# Patient Record
Sex: Male | Born: 1955 | Race: White | Hispanic: No | Marital: Married | Smoking: Former smoker
Health system: Southern US, Community
[De-identification: ages and names within clinical notes are randomized; demographics above are authoritative.]

## PROBLEM LIST (undated history)

## (undated) DIAGNOSIS — I10 Essential (primary) hypertension: Secondary | ICD-10-CM

## (undated) DIAGNOSIS — I779 Disorder of arteries and arterioles, unspecified: Secondary | ICD-10-CM

## (undated) DIAGNOSIS — M1990 Unspecified osteoarthritis, unspecified site: Secondary | ICD-10-CM

## (undated) DIAGNOSIS — E785 Hyperlipidemia, unspecified: Secondary | ICD-10-CM

## (undated) DIAGNOSIS — G4730 Sleep apnea, unspecified: Secondary | ICD-10-CM

## (undated) DIAGNOSIS — I739 Peripheral vascular disease, unspecified: Secondary | ICD-10-CM

## (undated) DIAGNOSIS — E119 Type 2 diabetes mellitus without complications: Secondary | ICD-10-CM

## (undated) DIAGNOSIS — G459 Transient cerebral ischemic attack, unspecified: Secondary | ICD-10-CM

## (undated) DIAGNOSIS — R55 Syncope and collapse: Secondary | ICD-10-CM

## (undated) DIAGNOSIS — I2510 Atherosclerotic heart disease of native coronary artery without angina pectoris: Secondary | ICD-10-CM

## (undated) HISTORY — DX: Unspecified osteoarthritis, unspecified site: M19.90

## (undated) HISTORY — DX: Hyperlipidemia, unspecified: E78.5

## (undated) HISTORY — DX: Essential (primary) hypertension: I10

## (undated) HISTORY — PX: CARDIAC CATHETERIZATION: SHX172

## (undated) HISTORY — DX: Transient cerebral ischemic attack, unspecified: G45.9

## (undated) HISTORY — DX: Type 2 diabetes mellitus without complications: E11.9

## (undated) HISTORY — DX: Peripheral vascular disease, unspecified: I73.9

## (undated) HISTORY — DX: Disorder of arteries and arterioles, unspecified: I77.9

## (undated) HISTORY — DX: Atherosclerotic heart disease of native coronary artery without angina pectoris: I25.10

## (undated) HISTORY — DX: Syncope and collapse: R55

## (undated) HISTORY — PX: OTHER SURGICAL HISTORY: SHX169

---

## 1978-02-20 DIAGNOSIS — I219 Acute myocardial infarction, unspecified: Secondary | ICD-10-CM

## 1978-02-20 HISTORY — DX: Acute myocardial infarction, unspecified: I21.9

## 2000-10-24 ENCOUNTER — Ambulatory Visit (HOSPITAL_COMMUNITY): Admission: RE | Admit: 2000-10-24 | Discharge: 2000-10-24 | Payer: Self-pay | Admitting: Family Medicine

## 2000-10-24 ENCOUNTER — Encounter: Payer: Self-pay | Admitting: Family Medicine

## 2001-05-30 ENCOUNTER — Inpatient Hospital Stay (HOSPITAL_COMMUNITY): Admission: EM | Admit: 2001-05-30 | Discharge: 2001-05-31 | Payer: Self-pay | Admitting: Cardiology

## 2001-05-30 ENCOUNTER — Encounter: Payer: Self-pay | Admitting: Emergency Medicine

## 2001-05-30 ENCOUNTER — Emergency Department (HOSPITAL_COMMUNITY): Admission: EM | Admit: 2001-05-30 | Discharge: 2001-05-30 | Payer: Self-pay | Admitting: Emergency Medicine

## 2001-11-04 ENCOUNTER — Ambulatory Visit (HOSPITAL_COMMUNITY): Admission: RE | Admit: 2001-11-04 | Discharge: 2001-11-04 | Payer: Self-pay | Admitting: Family Medicine

## 2001-11-04 ENCOUNTER — Encounter: Payer: Self-pay | Admitting: Family Medicine

## 2001-11-10 ENCOUNTER — Emergency Department (HOSPITAL_COMMUNITY): Admission: EM | Admit: 2001-11-10 | Discharge: 2001-11-10 | Payer: Self-pay | Admitting: Emergency Medicine

## 2001-11-14 ENCOUNTER — Other Ambulatory Visit: Admission: RE | Admit: 2001-11-14 | Discharge: 2001-11-14 | Payer: Self-pay | Admitting: Dermatology

## 2002-05-03 ENCOUNTER — Inpatient Hospital Stay (HOSPITAL_COMMUNITY): Admission: EM | Admit: 2002-05-03 | Discharge: 2002-05-04 | Payer: Self-pay | Admitting: *Deleted

## 2002-08-19 ENCOUNTER — Inpatient Hospital Stay (HOSPITAL_COMMUNITY): Admission: EM | Admit: 2002-08-19 | Discharge: 2002-08-21 | Payer: Self-pay | Admitting: Emergency Medicine

## 2002-08-19 ENCOUNTER — Encounter: Payer: Self-pay | Admitting: Emergency Medicine

## 2002-08-19 ENCOUNTER — Encounter: Payer: Self-pay | Admitting: Family Medicine

## 2002-08-20 ENCOUNTER — Encounter: Payer: Self-pay | Admitting: Neurology

## 2002-08-22 ENCOUNTER — Ambulatory Visit (HOSPITAL_COMMUNITY): Admission: RE | Admit: 2002-08-22 | Discharge: 2002-08-22 | Payer: Self-pay | Admitting: Family Medicine

## 2002-08-22 ENCOUNTER — Encounter: Payer: Self-pay | Admitting: Family Medicine

## 2002-10-08 ENCOUNTER — Ambulatory Visit (HOSPITAL_COMMUNITY): Admission: RE | Admit: 2002-10-08 | Discharge: 2002-10-08 | Payer: Self-pay | Admitting: Neurology

## 2003-02-21 DIAGNOSIS — G459 Transient cerebral ischemic attack, unspecified: Secondary | ICD-10-CM

## 2003-02-21 HISTORY — DX: Transient cerebral ischemic attack, unspecified: G45.9

## 2003-07-23 ENCOUNTER — Emergency Department (HOSPITAL_COMMUNITY): Admission: EM | Admit: 2003-07-23 | Discharge: 2003-07-23 | Payer: Self-pay | Admitting: Emergency Medicine

## 2003-07-24 ENCOUNTER — Emergency Department (HOSPITAL_COMMUNITY): Admission: EM | Admit: 2003-07-24 | Discharge: 2003-07-24 | Payer: Self-pay | Admitting: Emergency Medicine

## 2003-08-27 ENCOUNTER — Inpatient Hospital Stay (HOSPITAL_BASED_OUTPATIENT_CLINIC_OR_DEPARTMENT_OTHER): Admission: RE | Admit: 2003-08-27 | Discharge: 2003-08-27 | Payer: Self-pay | Admitting: Cardiovascular Disease

## 2003-09-16 ENCOUNTER — Ambulatory Visit (HOSPITAL_COMMUNITY): Admission: RE | Admit: 2003-09-16 | Discharge: 2003-09-16 | Payer: Self-pay | Admitting: Pulmonary Disease

## 2004-03-23 ENCOUNTER — Emergency Department (HOSPITAL_COMMUNITY): Admission: EM | Admit: 2004-03-23 | Discharge: 2004-03-24 | Payer: Self-pay | Admitting: *Deleted

## 2004-03-27 ENCOUNTER — Emergency Department (HOSPITAL_COMMUNITY): Admission: EM | Admit: 2004-03-27 | Discharge: 2004-03-27 | Payer: Self-pay | Admitting: Emergency Medicine

## 2004-11-16 ENCOUNTER — Ambulatory Visit: Payer: Self-pay | Admitting: Cardiology

## 2005-09-25 IMAGING — CR DG CHEST 1V PORT
1 series · 1 of 1 positions shown · non-contrast
Comparison: none

CLINICAL DATA: Chest pain.
 PORTABLE CHEST, ONE VIEW ? 07/23/2003 ? (9758 HOURS)
 Compared to 11/04/2001.
 Normal heart size, mediastinal contours and vascularity for technique.  Lungs are clear.  No effusion or pneumothorax.  Bones unremarkable.
 IMPRESSION
 No acute abnormalities.

[view not recorded]
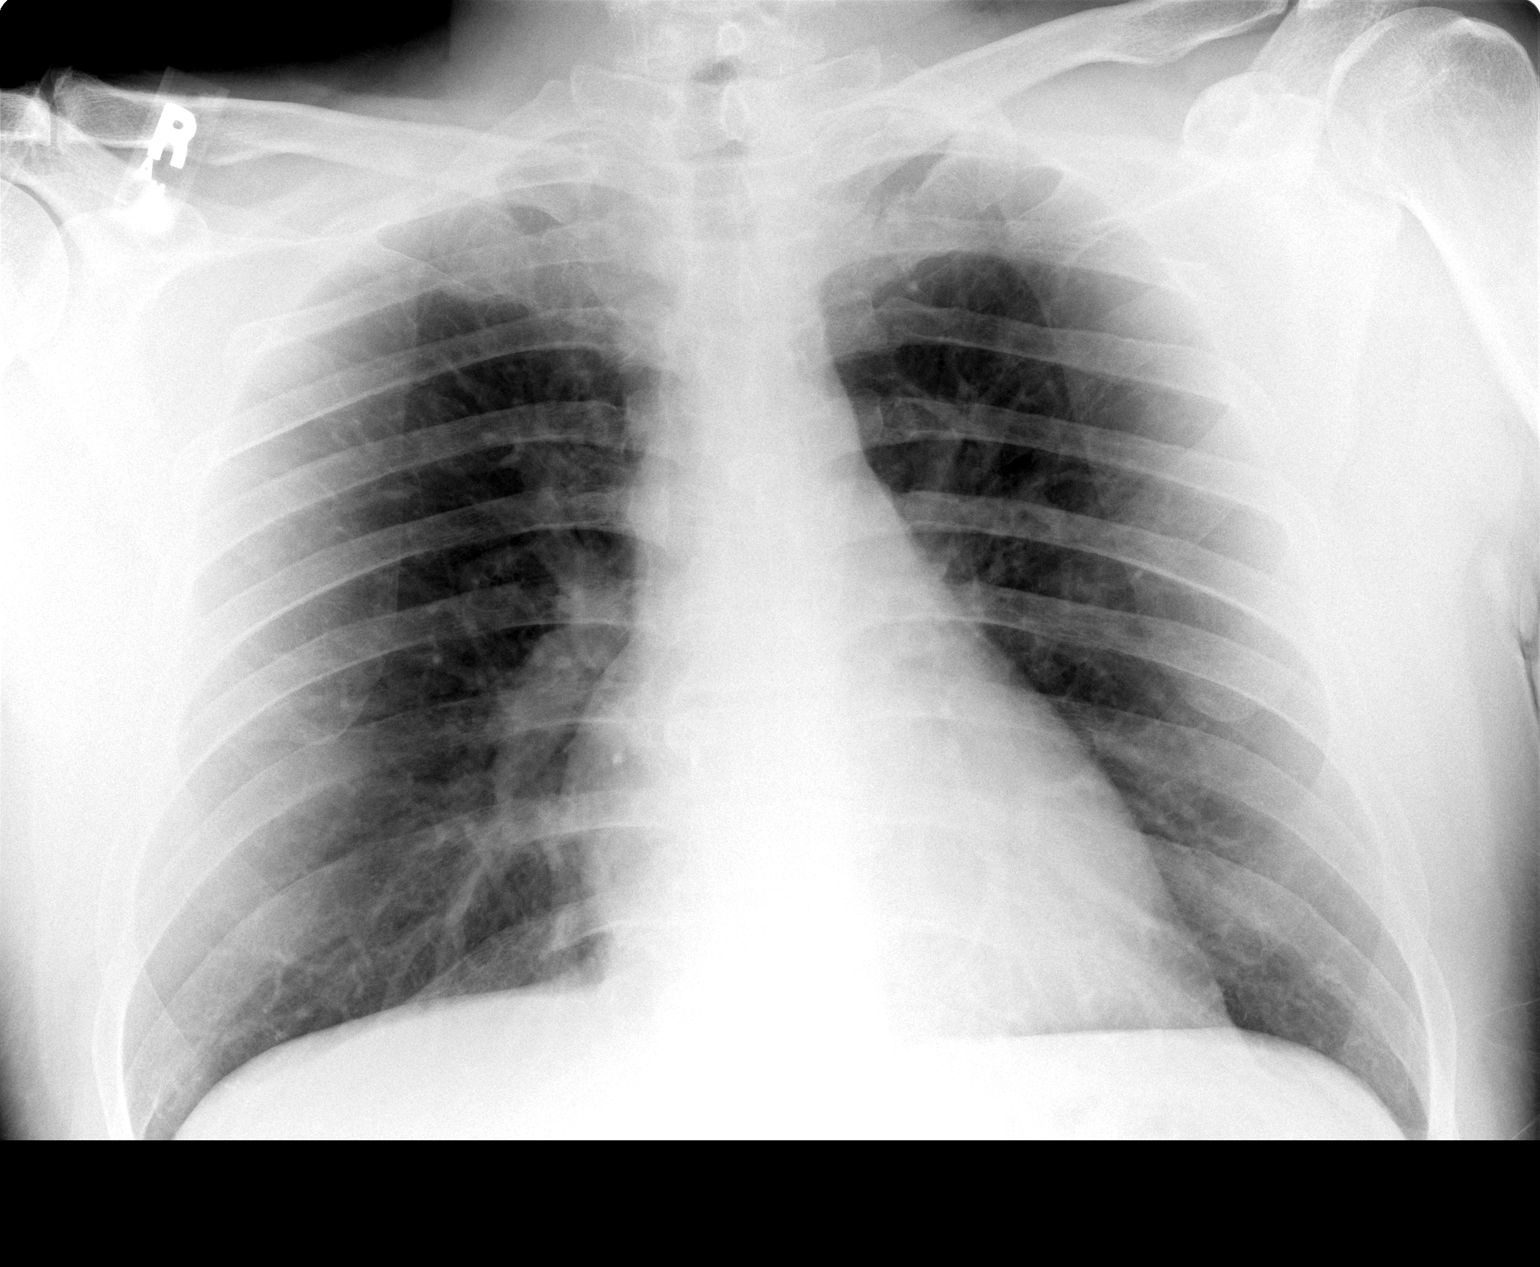

[1 of 1 positions shown; findings below may reference images not displayed]

## 2006-10-15 ENCOUNTER — Ambulatory Visit: Payer: Self-pay | Admitting: Cardiology

## 2006-10-15 ENCOUNTER — Encounter: Payer: Self-pay | Admitting: Physician Assistant

## 2006-10-16 ENCOUNTER — Encounter: Payer: Self-pay | Admitting: Cardiology

## 2006-10-18 ENCOUNTER — Encounter: Payer: Self-pay | Admitting: Cardiology

## 2006-10-30 ENCOUNTER — Ambulatory Visit: Payer: Self-pay | Admitting: Nurse Practitioner

## 2006-11-07 ENCOUNTER — Encounter: Payer: Self-pay | Admitting: Cardiology

## 2006-12-06 ENCOUNTER — Ambulatory Visit: Payer: Self-pay | Admitting: Cardiology

## 2007-01-21 ENCOUNTER — Encounter: Payer: Self-pay | Admitting: Cardiology

## 2008-01-19 ENCOUNTER — Encounter: Payer: Self-pay | Admitting: Cardiology

## 2008-03-10 ENCOUNTER — Ambulatory Visit: Payer: Self-pay | Admitting: Cardiology

## 2008-04-17 ENCOUNTER — Encounter: Payer: Self-pay | Admitting: Cardiology

## 2008-07-13 ENCOUNTER — Encounter: Payer: Self-pay | Admitting: Cardiology

## 2008-08-26 ENCOUNTER — Encounter: Payer: Self-pay | Admitting: Cardiology

## 2008-09-20 ENCOUNTER — Encounter: Payer: Self-pay | Admitting: Cardiology

## 2008-09-26 ENCOUNTER — Encounter: Payer: Self-pay | Admitting: Cardiology

## 2008-09-30 ENCOUNTER — Encounter: Payer: Self-pay | Admitting: Cardiology

## 2008-11-11 DIAGNOSIS — I10 Essential (primary) hypertension: Secondary | ICD-10-CM | POA: Insufficient documentation

## 2008-11-11 DIAGNOSIS — I2510 Atherosclerotic heart disease of native coronary artery without angina pectoris: Secondary | ICD-10-CM

## 2009-04-02 ENCOUNTER — Ambulatory Visit: Payer: Self-pay | Admitting: Cardiology

## 2009-04-02 DIAGNOSIS — E785 Hyperlipidemia, unspecified: Secondary | ICD-10-CM | POA: Insufficient documentation

## 2009-04-21 ENCOUNTER — Encounter: Payer: Self-pay | Admitting: Cardiology

## 2010-03-22 NOTE — Consult Note (Signed)
Summary: CARDIOLOGY CONSULT/ [facility]  CARDIOLOGY CONSULT/ [facility]   Imported By: Zachary George 02/22/2009 10:26:36  _____________________________________________________________________  External Attachment:    Type:   Image     Comment:   External Document

## 2010-03-22 NOTE — Assessment & Plan Note (Signed)
Summary: 1 yr fu -recv letter of reminder vs   Visit Type:  Follow-up Primary Provider:  Sherryll Burger  CC:  follow-up visit.  History of Present Illness: the patient is a 55 year old malewith history of chart of coronary artery disease and low risk Cardiolite stress study in 2008. For some time the patient was on Plavix for presumed TIA but this has been discontinued. The patient has stopped smoking. He quit about 2 years ago. He started exercising. He also gives to a diet now and has lost 20 pounds. He did report some shortness of breath several months ago at that time stress test was done which was reportedly negative for ischemia. The patient states it is felt the best in a long time. He denies any chest pain orthopnea PND palpitations or syncope.  Clinical Review Panels:  CXR CXR results The heart size and mediastinal contours are within normal         limits.  Both lungs are clear.                   IMPRESSION:         No active disease. (09/26/2008)  Carotid Studies Carotid Doppler Results No significant plaque formation identified on gray scale imaging. By         color Doppler imaging, laminar flow identified with minimal turbulence         at areas of tortuosity. No high velocity jets. Antegrade flow present         bilateral vertebral arteries. IMPRESSION:         No evidence of significant plaque formation or stenosis. (11/07/2006)  Cardiac Imaging Cardiac Cath Findings   IMPRESSION:  The patient does not have critical coronary artery disease.  I  suspect he had a false positive Cardiolite study.  It was read as low-risk.   I suspect the patient's chest tightness has more to do with his asthma and  lung problems.  He also seems to get relief with his inhaler.  He will  follow up with Dr. Butch Penny in regard to other noncardiac etiologies to  his chest pain and further therapy of his lung disease.   I will have him follow up with Dr. Dorethea Clan in three to four weeks just to  make sure his groin has healed well and to address any other possible risk  factors or workup of noncardiac etiology to his chest pain.                                             Charlton Haws, M.D. (08/27/2003)    Preventive Screening-Counseling & Management  Alcohol-Tobacco     Smoking Status: quit     Year Quit: 05/2007  Current Medications (verified): 1)  Lovastatin 40 Mg Tabs (Lovastatin) .... Take 2 Tablet By Mouth Once A Day 2)  Lisinopril 10 Mg Tabs (Lisinopril) .... Take 1 Tablet By Mouth Once A Day 3)  Omeprazole 20 Mg Cpdr (Omeprazole) .... Take 1 Tablet By Mouth Two Times A Day 4)  Aspirin 325 Mg Tabs (Aspirin) .... Take 1 Tablet By Mouth Once A Day  Allergies (verified): No Known Drug Allergies  Comments:  Nurse/Medical Assistant: The patient's medications and allergies were reviewed with the patient and were updated in the Medication and Allergy Lists. Bottles reviewed.  Past History:  Past Medical History: Last updated: 11/11/2008  HYPERTENSION, UNSPECIFIED (ICD-401.9) CAD, NATIVE VESSEL (ICD-414.01) Degenerative joint disease History of transient ischemic attack  Family History: Last updated: 11/11/2008 Family History of Cancer:   Social History: Last updated: 11/11/2008 Disabled  Married  Alcohol Use - no Regular Exercise - yes Drug Use - no Tobacco Use - Former.   Risk Factors: Smoking Status: quit (04/02/2009)  Review of Systems  The patient denies fatigue, malaise, fever, weight gain/loss, vision loss, decreased hearing, hoarseness, chest pain, palpitations, shortness of breath, prolonged cough, wheezing, sleep apnea, coughing up blood, abdominal pain, blood in stool, nausea, vomiting, diarrhea, heartburn, incontinence, blood in urine, muscle weakness, joint pain, leg swelling, rash, skin lesions, headache, fainting, dizziness, depression, anxiety, enlarged lymph nodes, easy bruising or bleeding, and environmental allergies.    Vital  Signs:  Patient profile:   55 year old male Height:      72 inches Weight:      286 pounds BMI:     38.93 Pulse rate:   59 / minute BP sitting:   122 / 77  (left arm) Cuff size:   large  Vitals Entered By: Carlye Grippe (April 02, 2009 9:00 AM)  Nutrition Counseling: Patient's BMI is greater than 25 and therefore counseled on weight management options. CC: follow-up visit   Physical Exam  Additional Exam:  General: Well-developed, well-nourished in no distress head: Normocephalic and atraumatic eyes PERRLA/EOMI intact, conjunctiva and lids normal nose: No deformity or lesions mouth normal dentition, normal posterior pharynx neck: Supple, no JVD.  No masses, thyromegaly or abnormal cervical nodesdid not drop stroke no carotid bruits lungs: Normal breath sounds bilaterally without wheezing.  Normal percussion heart: regular rate and rhythm with normal S1 and S2, no S3 or S4.  PMI is normal.  No pathological murmurs abdomen: Normal bowel sounds, abdomen is soft and nontender without masses, organomegaly or hernias noted.  No hepatosplenomegaly musculoskeletal: Back normal, normal gait muscle strength and tone normal pulsus: Pulse is normal in all 4 extremities Extremities: No peripheral pitting edema neurologic: Alert and oriented x 3 skin: Intact without lesions or rashes cervical nodes: No significant adenopathy psychologic: Normal affect    Impression & Recommendations:  Problem # 1:  CAD, NATIVE VESSEL (ICD-414.01) the patient has nonobstructive coronary artery disease. He denies any chest pain. Had a recent stress test. He can continue on his current medical regimen. The following medications were removed from the medication list:    Plavix 75 Mg Tabs (Clopidogrel bisulfate) .Marland Kitchen... Take one tablet by mouth daily    Altace 10 Mg Caps (Ramipril) .Marland Kitchen... Take 1 capsule by mouth once a day His updated medication list for this problem includes:    Lisinopril 10 Mg Tabs  (Lisinopril) .Marland Kitchen... Take 1 tablet by mouth once a day    Aspirin 325 Mg Tabs (Aspirin) .Marland Kitchen... Take 1 tablet by mouth once a day  Problem # 2:  HYPERTENSION, UNSPECIFIED (ICD-401.9) blood pressure well controlled. The following medications were removed from the medication list:    Altace 10 Mg Caps (Ramipril) .Marland Kitchen... Take 1 capsule by mouth once a day His updated medication list for this problem includes:    Lisinopril 10 Mg Tabs (Lisinopril) .Marland Kitchen... Take 1 tablet by mouth once a day    Aspirin 325 Mg Tabs (Aspirin) .Marland Kitchen... Take 1 tablet by mouth once a day  Problem # 3:  DYSLIPIDEMIA (ICD-272.4) cholesterol panels followed by Dr. Sherryll Burger.  His updated medication list for this problem includes:    Lovastatin 40 Mg Tabs (Lovastatin) .Marland KitchenMarland KitchenMarland KitchenMarland Kitchen  Take 2 tablet by mouth once a day  Patient Instructions: 1)  Your physician recommends that you continue on your current medications as directed. Please refer to the Current Medication list given to you today. 2)  Follow up in  1 year.

## 2010-04-13 ENCOUNTER — Encounter: Payer: Self-pay | Admitting: Cardiology

## 2010-04-13 ENCOUNTER — Ambulatory Visit (INDEPENDENT_AMBULATORY_CARE_PROVIDER_SITE_OTHER): Payer: Medicare Other | Admitting: Cardiology

## 2010-04-13 DIAGNOSIS — I2510 Atherosclerotic heart disease of native coronary artery without angina pectoris: Secondary | ICD-10-CM

## 2010-04-13 DIAGNOSIS — R609 Edema, unspecified: Secondary | ICD-10-CM | POA: Insufficient documentation

## 2010-04-13 DIAGNOSIS — E7800 Pure hypercholesterolemia, unspecified: Secondary | ICD-10-CM

## 2010-04-19 NOTE — Assessment & Plan Note (Signed)
Summary: 1 yrfu rec reminder no Hosp. visits vts   Visit Type:  Follow-up Primary Provider:  Sherryll Burger   History of Present Illness: The patient is a 55 year old male with a history of coronary artery disease and a low risk Cardiolite stress study in 2008.  The patient had been on Plavix in the past for presumed TIA but this has been discontinued.  The patient also stopped smoking. The patient also had a myocardial perfusion study performed in his primary care physician's office in 2010.  There was apparent normal myocardial perfusion and normal myocardial function with an ejection fraction of 71%. Blood work was done approximately a year ago.  Creatinine was within normal limits HDL cholesterol was 26 and LDL cholesterol was 78 with a total cholesterol of 123.  TSH was within normal limits.  CBC was also within normal limits. The patient denies any chest pain, shortness of breath, palpitations, orthopnea or PND.  His EKG was reviewed and was within normal limits.  The patient also has stopped smoking.  He has noticed some lower extremity edema and reports to me that he uses quite a bit of salt.  He has lost some more weight.  Is feeling quite well.  He does report that he has held his aspirin because of easy bruising and bleeding.   Preventive Screening-Counseling & Management  Alcohol-Tobacco     Smoking Status: quit  Comments: quit smoking 3 yrs ago, smoked off/on since 55 yrs old  Current Medications (verified): 1)  Lovastatin 40 Mg Tabs (Lovastatin) .... Take 2 Tablet By Mouth Once A Day 2)  Lisinopril 10 Mg Tabs (Lisinopril) .... Take 1 Tablet By Mouth Once A Day 3)  Omeprazole 20 Mg Cpdr (Omeprazole) .... Take 1 Tablet By Mouth Two Times A Day 4)  Aspirin 325 Mg Tabs (Aspirin) .... Take 1 Tablet By Mouth. Pt Takes "once in A While D/t Bleeding"  Allergies: No Known Drug Allergies  Comments:  Nurse/Medical Assistant: The patient's medications were reviewed with the patient and were  updated in the Medication List. Pt brought medication bottles to office visit.  Cyril Loosen, RN, BSN (April 13, 2010 10:58 AM)  Past History:  Family History: Last updated: 11/11/2008 Family History of Cancer:   Social History: Last updated: 11/11/2008 Disabled  Married  Alcohol Use - no Regular Exercise - yes Drug Use - no Tobacco Use - Former.   Risk Factors: Smoking Status: quit (04/13/2010)  Past Medical History: HYPERTENSION, UNSPECIFIED (ICD-401.9) CAD, NATIVE VESSEL (ICD-414.01) Degenerative joint disease History of transient ischemic attack minimal carotid artery disease.  Vital Signs:  Patient profile:   55 year old male Height:      72 inches Weight:      281.38 pounds BMI:     38.30 Pulse rate:   64 / minute BP sitting:   115 / 77  (left arm) Cuff size:   large  Vitals Entered By: Cyril Loosen, RN, BSN (April 13, 2010 10:54 AM)  Nutrition Counseling: Patient's BMI is greater than 25 and therefore counseled on weight management options. Comments No cardiac complaints.   Physical Exam  Additional Exam:  General: Well-developed, well-nourished in no distress head: Normocephalic and atraumatic eyes PERRLA/EOMI intact, conjunctiva and lids normal nose: No deformity or lesions mouth normal dentition, normal posterior pharynx neck: Supple, no JVD.  No masses, thyromegaly or abnormal cervical nodesdid not drop stroke no carotid bruits lungs: Normal breath sounds bilaterally without wheezing.  Normal percussion heart: regular rate and rhythm  with normal S1 and S2, no S3 or S4.  PMI is normal.  No pathological murmurs abdomen: Normal bowel sounds, abdomen is soft and nontender without masses, organomegaly or hernias noted.  No hepatosplenomegaly musculoskeletal: Back normal, normal gait muscle strength and tone normal pulsus: Pulse is normal in all 4 extremities Extremities: No peripheral pitting edema neurologic: Alert and oriented x 3 skin:  Intact without lesions or rashes cervical nodes: No significant adenopathy psychologic: Normal affect    EKG  Procedure date:  04/14/2010  Findings:      normal sinus rhythm..   Early repolarization probably normal variant.  Otherwise normal tracing.  Heart rate 64 beats/min  Impression & Recommendations:  Problem # 1:  CAD, NATIVE VESSEL (ICD-414.01) nonobstructive coronary artery disease: Patient has stopped aspirin because of easy bruisability and bleeding. His updated medication list for this problem includes:    Lisinopril 10 Mg Tabs (Lisinopril) .Marland Kitchen... Take 1 tablet by mouth once a day    Aspirin 325 Mg Tabs (Aspirin) .Marland Kitchen... Take 1 tablet by mouth. pt takes "once in a while d/t bleeding"  Orders: EKG w/ Interpretation (93000)  Problem # 2:  DYSLIPIDEMIA (ICD-272.4) continue statin drug therapy. His updated medication list for this problem includes:    Lovastatin 40 Mg Tabs (Lovastatin) .Marland Kitchen... Take 2 tablet by mouth once a day  Problem # 3:  EDEMA (ICD-782.3) lower extremity edema: Recommend to stop additional salt intake.  Patient Instructions: 1)  Your physician recommends that you continue on your current medications as directed. Please refer to the Current Medication list given to you today. 2)  Follow up in  1 year

## 2010-07-05 NOTE — Assessment & Plan Note (Signed)
[facility]                          [facility] NOTE   NAME:Logan Hunt, Logan Hunt                     MRN:          [id number]  DATE:03/10/2008                            DOB:          [birth year]    REFERRING PHYSICIAN:  Kirstie Peri, MD   HISTORY OF PRESENT ILLNESS:  The patient is a 55 year old male with a  history of prior negative stress test and good exercise tolerance.  He  has normal LV function.  Most remarkably, the patient used to be a 3-  pack-a-day smoker, has now discontinued tobacco use.  He states that he  is feeling much better and has less shortness of breath.  His appetite,  however, has markedly increased and he did gain approximately 10 pounds  since his last office visit.  He denies any chest pain, orthopnea, or  PND.  He has no claudication symptoms.   MEDICATIONS:  1. Lisinopril 10 mg p.o. daily  2. Nexium 40 mg p.o. daily.  3. Pravastatin 40 mg p.o. daily.  4. Plavix 75 mg p.o. daily.   PHYSICAL EXAMINATION:  VITAL SIGNS:  Blood pressure is 140/78, heart  rate is 63, and weight is 294 pounds.  NECK:  Normal carotid upstroke and no carotid bruits.  LUNGS:  Clear breath sounds bilaterally.  HEART:  Regular rate and rhythm.  Normal S1 and S2.  No murmurs, rubs,  or gallops.  ABDOMEN:  Soft and nontender.  No rebound or guarding.  Good bowel  sounds.  EXTREMITIES:  No cyanosis, clubbing, or edema.   PROBLEM LIST:  1. Tobacco use discontinued.  2. Nonobstructive coronary artery disease and negative low-risk      Cardiolite study 2 years ago.  3. History of transient ischemic attack in 2004 and Plavix therapy.  4. Degenerative joint disease.  5. Hypertension controlled.   PLAN:  1. The patient is doing significantly better now that he has stopped      smoking.  His blood pressure seems better controlled.  2. Reviewed his lipid panel from November 2009, and he had an LDL of      106 and total cholesterol 164.  His  triglycerides were within      normal limits.  __________ that he has stopped smoking and gained      weight, we will repeat his lipid panel.  3. __________ the patient is due for a repeat stress test as well as      abdominal ultrasound to rule out AAA given his longstanding history      of tobacco use.     Learta Codding, MD,FACC  Electronically Signed    GED/MedQ  DD: 03/10/2008  DT: 03/10/2008  Job #: [id number]   cc:   Kirstie Peri, MD

## 2010-07-05 NOTE — Assessment & Plan Note (Signed)
[facility]                          [facility]   NAME:Logan Hunt, Logan Hunt                     MRN:          [id number]  DATE:12/06/2006                            DOB:          September 17, 1955    REFERRING PHYSICIAN:  Kirstie Peri, M.D.   HISTORY OF PRESENT ILLNESS:  The patient is a white male with a history  of coronary artery disease and status post catheterization in 2005.  The  patient presents for follow-up.  He was last seen in the office on  October 30, 2006, by Dorian Pod, ACNP.  He had a recent  hospitalization for TIA.  He was noncompliant with Plavix and aspirin  therapy.  He is currently on this medical regimen.  He had carotid  Dopplers done which were essentially within normal limits.  The patient  also had a Cardiolite stress study done which was low risk and the  patient was able to achieve 10.1 mets.  Echocardiogram also demonstrated  normal left ventricular function.  The patient has been doing well.  He  stated he has cut down on his tobacco use.  The patient had an apnea  link monitor placed and was intermediate range for suspected pathology  for breathing disorder.  He only had three apneic episodes per hour.  He  states that he has been doing quite well.  He denies any chest pain,  shortness of breath, orthopnea, or PND.  He has no palpitations or  syncope.   MEDICATIONS:  1. Lisinopril 10 mg p.o. daily.  2. Nexium 40 mg p.o. daily.  3. Lovastatin 40 mg p.o. daily.  4. Plavix 75 mg p.o. daily.  5. Aspirin 81 mg p.o. daily.   PHYSICAL EXAMINATION:  VITAL SIGNS:  Blood pressure 135/81, heart rate  69 beats per minute, weight 284 pounds.  NECK:  Normal carotid upstroke, no carotid bruits.  LUNGS:  Clear breath sounds bilaterally.  HEART:  Regular rate and rhythm with normal S1 and S2.  No murmurs,  rubs, or gallops.  ABDOMEN:  Soft, nontender.  No rebound or guarding.  Good bowel sounds.  EXTREMITIES:  No  cyanosis, clubbing, or edema.  NEUROLOGY:  The patient is alert and oriented and grossly nonfocal.   PROBLEM LIST:  1. Tobacco use.  2. Coronary artery disease, nonobstructive, status post recent low      risk Cardiolite study.  3. History of transient ischemic attack in 2004.  4. Medical noncompliance.  5. History of degenerative joint disease.  6. History of hypertension.  7. Rule out obstructive sleep apnea.   PLAN:  1. The patient has been referred to Marcello Moores, M.D. to follow-      up on his apnea link monitor.  2. The patient can continue with aspirin and Plavix for now, although      in the future certainly we could get him back to Plavix alone.  3. I have encouraged the patient again to discontinue tobacco abuse.  4. The patient will follow up with [place] in the office in the next six      months  to one year.     Learta Codding, MD,FACC  Electronically Signed    GED/MedQ  DD: 12/07/2006  DT: 12/07/2006  Job #: [id number]   cc:   Kirstie Peri, MD

## 2010-07-05 NOTE — Assessment & Plan Note (Signed)
[facility]                          [facility]   NAME:Logan Hunt, Logan Hunt                     MRN:          [id number]  DATE:10/30/2006                            DOB:          11/20/1955    PRIMARY CARDIOLOGIST:  Learta Codding, MD,FACC.   PRIMARY CARE PHYSICIAN:  Kirstie Peri, M.D.   [name] returns today for followup status post recent hospitalization  for questionable TIA.  [name] was seen in consultation by Dr. Andee Lineman  during that visit.  Mr. Burmester has a reported history of coronary artery  disease, status post previous percutaneous intervention at [facility] several years ago, who presented to [facility] this  admission, complaining of chest discomfort in the setting of blurred  vision and dizziness.  Patient reported had a negative cardiac  catheterization at [facility] also.  On this admission the patient states  that he developed sudden left-sided chest pain which lasted less than a  second followed by blurred vision, which persisted for several hours.  He had some mild associated dyspnea but no diaphoresis or nausea.  Patient describes the chest discomfort as dull and squeezing in quality.   Patient was more concerned about the blurred vision.  He states that it  reminded him of a previous mini stroke he had approximately four years  ago.   In the emergency room at [facility], patient's blood pressure was 146/79.  EKG showed sinus rhythm without acute ST-T wave changes.  Patient was  treated with sublingual nitroglycerin, ruled out for MI by cardiac  markers.  Patient's chest x-ray showed no acute findings.   Patient underwent stress Myoview during hospitalization that showed an  ejection fraction of 56% with no ischemia.  The patient also had an  echocardiogram done on August 26th that showed an ejection fraction of  60% with mild left ventricular hypertrophy.  No definite source of  embolus noted on  that study.   Patient was apparently discharged home in stable condition.  There is no  discharge summary from that hospitalization available at this time.  It  is unclear whether or not the patient had a head CT during that  hospitalization or clarification of medicines he was discharged home on.   Apparently, the patient stopped taking his Plavix several months ago.  He stated he continues to remain very active at home, does a lot of  physical labor outside.  He got tired of nicking himself and having to  go inside to get the bleeding to stop, so he stopped the Plavix and only  takes his aspirin every once in a while when he remembers to.  Unfortunately, however, he continues to smoke 1-2 packs of cigarettes a  day.   PAST MEDICAL HISTORY:  1. Ongoing tobacco use.  2. Coronary artery disease, nonobstructive disease by cardiac      catheterization in 2005 (following a false positive stress test at      that time).      a.     Multiple stenoses 30% in LAD and first diagonal, 30% distal  right coronary artery disease, normal left ventricular function.      b.     Status post recent stress Myoview during hospitalization       showing a normal EF with no ischemia.      c.     Patient reports previous multiple cardiac catheterizations       and balloon procedures.  3. Status post transient ischemic attack in 2004, treated with Plavix,      which the patient has stopped taking.  4. Medical noncompliance.  5. Gastroesophageal reflux disease.      a.     History of peptic ulcer disease.  6. 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  7. Hypertension.  8. Hyperlipidemia.  9. Obesity.  10.Disability secondary to severe lower back pain.   REVIEW OF SYSTEMS:  As stated above.   CURRENT MEDICATIONS:  1. Allegra 180 mg daily.  2. Lisinopril 10 mg daily.  3. Nexium 40 mg daily.  4. Lovastatin 40 mg daily.  5. Albuterol inhaler p.r.n.   PHYSICAL EXAMINATION:  Weight  277.8.  Blood pressure 133/73 with a heart  rate of 60.  Mr. Logan Hunt is in no acute distress.  He is a pleasant 55 year old  Caucasian gentleman.  He is in no acute distress.  HEENT:  Unremarkable.  NECK:  Supple without lymphadenopathy, JVD, or bruits.  LUNGS:  Clear to auscultation bilaterally.  CARDIOVASCULAR:  An S1 and S2.  No significant murmurs.  ABDOMEN:  Soft and nontender.  Positive bowel sounds.  Protuberant.  LOWER EXTREMITIES:  Without clubbing, cyanosis or edema.  NEUROLOGIC:  Alert and oriented x3.  No focal deficits.   IMPRESSION:  History of coronary artery disease with negative stress  test at this time, status post recent hospitalization for a questionable  transient ischemic attack with patient being noncompliant with Plavix  and aspirin therapy.  I have recommended that patient resume his Plavix  75 mg daily, resume aspirin at 81 mg daily.  I have given him a new  prescription for the Plavix and new prescription for his albuterol  inhaler.  I am going to schedule him from carotid Dopplers.  He states  that it has been several years since he had carotid Dopplers done.  He  has also complained of symptoms suggestive of sleep apnea and increased  daytime somnolence  and fatigue.  Scheduled him for apneic link with overnight pulse  oximetry.  Patient is agreeable to this.  We will see him in followup  after his tests have been done.      Dorian Pod, ACNP  Electronically Signed      Jonelle Sidle, MD  Electronically Signed   MB/MedQ  DD: 10/30/2006  DT: 10/30/2006  Job #: [id number]   cc:   Kirstie Peri, MD

## 2010-07-08 NOTE — Procedures (Signed)
   NAME:  ROURKE, MCQUITTY                        ACCOUNT NO.:  [id number]   MEDICAL RECORD NO.:  [id number]                   PATIENT TYPE:  INP   LOCATION:  A320                                 FACILITY:  [facility]   PHYSICIAN:  Edward L. Juanetta Gosling, M.D.             DATE OF BIRTH:  1955/08/01   DATE OF PROCEDURE:  08/19/2002  DATE OF DISCHARGE:                                EKG INTERPRETATION   RESULTS:  The rhythm is sinus rhythm with rate in the 60s.  There is some  suggestion of atrial enlargement.                                                 Edward L. Juanetta Gosling, M.D.    ELH/MEDQ  D:  08/19/2002  T:  08/19/2002  Job:  [id number]

## 2010-07-08 NOTE — Consult Note (Signed)
NAME:  Logan Hunt, Logan Hunt                        ACCOUNT NO.:  [id number]   MEDICAL RECORD NO.:  [id number]                   PATIENT TYPE:  INP   LOCATION:  A320                                 FACILITY:  [facility]   PHYSICIAN:  Kofi A. Gerilyn Pilgrim, M.D.              DATE OF BIRTH:  1955/04/05   DATE OF CONSULTATION:  DATE OF DISCHARGE:                                   CONSULTATION   IMPRESSION:  Likely small right hemispheric infarct. The potential  consideration includes possibility of atypical seizure presentation.   RECOMMENDATIONS:  Agree with current plan which includes echo and carotid  duplex doppler.  We are going to go ahead and discontinue the heparin as  this is not ________ to be beneficial over either antiplatelet agents but  would increase risk of hemorrhagic transformation.  Further workup includes  MRI and MRA of the brain, homocystine level and methylmalonic acid level,  vitamin B-12 level and an EEG.  We are also going to add Plavix to the  aspirin for a combination antiplatelet agent therapy.   This is a 55 year old, right handed, Caucasian man who has significant  cardiovascular risk factors including coronary artery disease, hypertension,  dyslipidemia who apparently has had three spells of left facial dysesthesias  and pulling sensation.  He had two prior to being admitted this past month,  both episodes lasted about 15 minutes.  The episode today also involved the  left upper extremity.  He reports having tingling in the left upper  extremity.  Additionally, he reports more intense pulling on the left facial  region with obscuration of vision and blurring of the vision on the left  side.  No other focal neurological symptoms are reported.  His symptoms have  improved but he has visual numbness involving the left facial region.   PAST MEDICAL HISTORY:  1. As stated above.  Additionally there is a history of hiatal hernia.  2. Gastroesophageal reflux disease.  3.  History coronary disease.  4. Myocardial infarction in 1978.  5. Hypertension.  6. Hypercholesterolemia.  7. He has had angioplasty after a myocardial infarction in 1978.   MEDICATIONS:  1. Aspirin 325 mg once a day.  2. Zocor.  3. Prevacid.  4. Altace.  5. Zetia.   SOCIAL HISTORY:  He is married, has four children and 11 grandchildren.  He  does smoke cigarettes.  He has done so for many years.   REVIEW OF SYSTEMS:  Stated in the history of present illness, otherwise  unremarkable.   PHYSICAL EXAMINATION:  GENERAL:  Shows a pleasant gentleman, no acute  distress.  VITAL SIGNS:  Blood pressure 137/71, heart rate 72, respirations 18, the  saturation 98%.  HEENT:  Showed the neck is supple.  NEUROLOGIC:  He is awake and alert.  He converses fluently currently and  with no dysarthria.  Language is grossly unremarkable.  Cranial nerve  evaluation shows that  pupils are 5 mm and briskly reactive.  The visual  fields were intact.  Extraocular movements are intact.  Facial muscle  strength is normal.  Tongue is midline.  Uvula is midline.  Facial sensation  reveals impaired sensation on the left side of the face, particularly the  lower two thirds.  Shoulder shrug is normal.  MUSCULOSKELETAL:  Shows normal tone, muscle strength.  There is no pronator  drift.  Coordination is intact.  Reflexes are +2.  Plantar reflexes are both  downgoing.  Gait is unremarkable.   CT scan of the brain is normal.   Carotid has been done.  The results are unavailable.                                                Kofi A. Gerilyn Pilgrim, M.D.    KAD/MEDQ  D:  08/19/2002  T:  08/19/2002  Job:  [id number]

## 2010-07-08 NOTE — H&P (Signed)
NAME:  Logan Hunt, BEZA NO.:  [id number]   MEDICAL RECORD NO.:  [id number]                  PATIENT TYPE:   LOCATION:                                       FACILITY:   PHYSICIAN:  Angus G. Renard Matter, M.D.              DATE OF BIRTH:   DATE OF ADMISSION:  DATE OF DISCHARGE:                                HISTORY & PHYSICAL   A 55 year old white male who was admitted after having been seen in the ED  by the ED physician.  The patient presented there with the chief complaint  being blurred vision in the left eye, a drawing sensation on the left side  of face and numbness on the left side of the face, and tingling sensation in  the left arm.  The patient had a CT which was negative for stroke.  Apparently this patient had experienced several episodes of this sort prior  to this episode and they lasted for a shorter period of time.  He denied any  weakness in the left arm or left leg.  It was recommended that he be  admitted and placed on IV heparin, and a workup to include echocardiogram,  carotid Doppler ultrasound to be continued.   FAMILY HISTORY:  See previous records.   SOCIAL HISTORY:  The patient is a two-pack-a-day cigarette smoker.   PAST MEDICAL AND SURGICAL HISTORY:  1. History of coronary artery disease with heart catheterization recently,     with no definite evidence of coronary artery occlusive disease.  2. History of dyslipidemia.  3. History of hypertension.  4. Amaurosis fugax.  5. History of fracture of the L-spine.  6. History of migraine variant.   MEDICATION LIST:  1. Zocor 20 mg daily.  2. Aspirin 325 mg daily.  3. Prevacid 30 mg daily.  4. Altace 10 mg daily.  5. Zetia 10 mg daily.   ALLERGIES:  No known allergies.   REVIEW OF SYSTEMS:  HEENT:  Negative with the exception of numbness of left  side of face.  CARDIOPULMONARY:  No cough, hemoptysis, or dyspnea.  GASTROINTESTINAL:  No bowel irregularity or bleeding.   GENITOURINARY:  No  dysuria or hematuria.   EXAMINATION:  Alert white male with a blood pressure of 116/64, pulse 60,  respirations 18, temp 98.  HEENT:  Eyes:  PERRLA.  TMs negative.  Oropharynx benign.  NECK:  Supple.  No JVD or thyroid abnormalities.  LUNGS:  Clear to P&A.  HEART:  Regular rhythm, no murmurs, no cardiomegaly.  ABDOMEN:  No palpable organs or masses.  SKIN:  Warm and dry.  EXTREMITIES:  Free of edema.  NEUROLOGICAL:  Cranial nerves intact.  No motor weakness in the extremities.  Decreased sensation on the left side of face.   DIAGNOSES:  1. Transient ischemic attack, questionable.  2. History of hypertension.  3. History of coronary artery disease.  4. History of dyslipidemia.  5. History of amaurosis fugax.                                               Angus G. Renard Matter, M.D.    AGM/MEDQ  D:  08/19/2002  T:  08/19/2002  Job:  [id number]

## 2010-07-08 NOTE — Group Therapy Note (Signed)
   NAME:  Logan Hunt, Logan Hunt                        ACCOUNT NO.:  [id number]   MEDICAL RECORD NO.:  [id number]                   PATIENT TYPE:  INP   LOCATION:  A320                                 FACILITY:  [facility]   PHYSICIAN:  Angus G. Renard Matter, M.D.              DATE OF BIRTH:  Mar 26, 1955   DATE OF PROCEDURE:  DATE OF DISCHARGE:                                   PROGRESS NOTE   SUBJECTIVE:  This patient was admitted with numbness of the left side of  face, weakness of left hand.  He was felt to have a small right hemispheric  infarct.  The patient's condition is stable.   OBJECTIVE:  Vital signs:  Blood pressure 111/64, respirations 20, pulse 52,  temperature 97.1.  Lungs:  Clear to P&A.  Heart:  Regular rhythm.  Abdomen:  No palpable organs or masses.   ASSESSMENT:  The patient was admitted with numbness of the left side of  face, left arm, also right hemispheric infarct.  There is some question of  multiple sclerosis.  MRI scan.   PLAN:  To continue regimen.                                               Angus G. Renard Matter, M.D.    AGM/MEDQ  D:  08/21/2002  T:  08/21/2002  Job:  [id number]

## 2010-07-08 NOTE — Group Therapy Note (Signed)
   NAME:  CHOSEN, GESKE                        ACCOUNT NO.:  [id number]   MEDICAL RECORD NO.:  [id number]                   PATIENT TYPE:  INP   LOCATION:  A320                                 FACILITY:  [facility]   PHYSICIAN:  Kofi A. Gerilyn Pilgrim, M.D.              DATE OF BIRTH:  1955-02-22   DATE OF PROCEDURE:  DATE OF DISCHARGE:                                   PROGRESS NOTE   I have some more information from the patient here.  Currently he has  recurrent spells in the past.  He had a spell about 14 years ago where he  suddenly lost the vision in both eyes.  He saw an ophthalmologist, at that  time, who apparently told him that he had ministrokes __________.  He was  placed on aspirin at that time.  He has had other spells.  Apparently had  one 3 years ago which apparently resulted in an MRI of the brain being done.  His symptoms from last night have improved particularly the left facial  pulling and past seizures.   SUBJECTIVE:  Today he is alert, converses with normal speech, language and  cognition.  MRI of the brain shows unchanged white matter lesions from 2000.  No new findings.  Carotid Dopplers are essentially unremarkable.  There are  no minimal plaques seen with no hemodynamic significant stenosis.  Echo has  been done and is pending.  EEG has not been done.   ASSESSMENT AND PLAN:  Patient with multiple neurological symptoms raising  the possibility of multiple sclerosis certainly this may be a transient  ischemic attack.  We will repeat MRI with gallium contrast.  Other testing  to be considered would be spinal tap, visual evoked response, and EEG (that  is already pending).  I will continue with aspirin and Plavix combination.                                               Kofi A. Gerilyn Pilgrim, M.D.    KAD/MEDQ  D:  08/20/2002  T:  08/20/2002  Job:  [id number]

## 2010-07-08 NOTE — Procedures (Signed)
NAME:  Logan Hunt, Logan Hunt                        ACCOUNT NO.:  [id number]   MEDICAL RECORD NO.:  [id number]                   PATIENT TYPE:  OUT   LOCATION:  RESP                                 FACILITY:  [facility]   PHYSICIAN:  Edward L. Juanetta Gosling, M.D.             DATE OF BIRTH:  May 04, 1955   DATE OF PROCEDURE:  DATE OF DISCHARGE:  09/16/2003                              PULMONARY FUNCTION TEST   RESULTS:  1. Spirometry is normal.  2. Lung volumes show air trapping.  3. DLCO is mildly reduced.      ___________________________________________                                            Oneal Deputy. Juanetta Gosling, M.D.   ELH/MEDQ  D:  09/18/2003  T:  09/18/2003  Job:  [id number]

## 2010-07-08 NOTE — Consult Note (Signed)
NAME:  Logan Hunt, Logan Hunt                        ACCOUNT NO.:  [id number]   MEDICAL RECORD NO.:  [id number]                   PATIENT TYPE:  INP   LOCATION:  A337                                 FACILITY:  [facility]   PHYSICIAN:  Lionel December, M.D.                 DATE OF BIRTH:  [birth year]   DATE OF CONSULTATION:  05/03/2002  DATE OF DISCHARGE:                                   CONSULTATION   REASON FOR CONSULTATION:  Rectal bleeding.   HISTORY OF PRESENT ILLNESS:  Logan Hunt is a 55 year old Caucasian male who was  admitted to Dr. Megan Mans' service via emergency room by Dr. Ouida Sills where he  presented with acute onset of rectal bleeding.  The patient states he was  fine when he went to bed last night.  He woke up around 4:30 with cramps  across his lower abdomen.  He went to the bathroom and passes dark, liquid  stool.  About 10 minutes later, he went again and passed fresh blood per  rectum.  Since then he has had multiple bowel movements that are non-bloody  when his spasms calm down.  He is now pain free.  His bowels generally move  regularly.  He denies anorexia, weight loss, nausea and vomiting, fever, or  chills.  There is no history of recent antibiotic use.  He has been on  dicofenac for back problems, but he reports no side effects.  He has chronic  heartburn which is well controlled with therapy.   CURRENT MEDICATIONS:  Prevacid 30 mg q.a.m., diclofenac 75 mg b.i.d., Zocor  20 mg daily, Zetia 10 mg daily, Altace 10 mg daily, and he is also on ASA  325 mg daily.   PAST MEDICAL HISTORY:  Coronary artery disease.  He had angioplasty about 15  years ago.  A little over 3 years ago, he presented to this emergency room  with chest pain and was transferred to [facility].  He had cardiac testing work-  up which was normal.  His local cardiologist is Dr. Daleen Squibb.  About 20+ years  ago he was admitted with hematemesis.  Upper GI suggested ulcer but he  remember distinctly that EGD was negative.   H. Pylori status is unknown.  He  has chronic back problems.  He also has uncomplicated GERD.  Hyperlipidemia.   ALLERGIES:  None known.   FAMILY HISTORY:  Father is a diabetic and is hypertensive.  Mother has had  surgery for breast carcinoma and lung carcinoma and doing fairly well.  She  is in her 49s.  He has a sister with hypertension, another sister and  brother are in good health.   SOCIAL HISTORY:  He is married, has 4 children.  He is disabled on account  of his back problems.  He smokes 1/2 to 1 pack per day.  He does not drink  alcohol.  He and his wife live in  [place].   PHYSICAL EXAMINATION:  Pleasant, morbidly obese Caucasian male who is no  acute distress.  He weighs 273.6 pounds.  He is 6 feet all.  Pulse 62, blood  pressure 155, respirations 20, temperature 97.2.  Conjunctivae are pink,  sclerae are nonicteric.  Oropharyngeal mucosa is normal.  He has 2 teeth in  lower jaw.  NECK:  Without masses or thyromegaly.  Pulses are 2+ bilaterally, and  without bruits.  CARDIAC EXAM:  Regular rate and rhythm, normal S1, S2, no murmur or gallop  noted.  LUNGS:  Clear to auscultation.  ABDOMEN:  Full, bowel sounds are normal, no bruits noted.  Palpation reveals  soft abdomen without tenderness, organomegaly or masses.  Rectal examination  deferred as he had one in the emergency room revealing fresh blood on  digital exam.  EXTREMITIES;  No clubbing or peripheral edema noted.   LABORATORIES:  WBC 9.2, H&H 15.6 and 44.9, platelet count was 60K.  INR was  0.9, PTT 29.  Sodium 137, potassium 4.6, chloride 103, CO2 is 27, glucose  150 and BUN 15, creatinine 1.3, calcium 9.2, total protein 6.3, albumin is  4.0, bilirubin is 1.2, alkaline phosphatase 72, AST 26, ALT 29.   ASSESSMENT:  Logan Hunt is a 55 year old Caucasian male who presents with  sudden onset of lower abdominal cramps and fresh blood per rectum.  I  suspect we are dealing with ischemic colitis.  Other potential  etiology  would be NSAID colopathy.  Enteric infection, neoplasm, etc would be less  likely.  There is  remote history  of peptic ulcer disease or GI bleeding.  H. Pylori status is unknown.  I do not feel that he is losing blood from his  upper GI tract given his presentation.  His H&H this evening was 14.9 and  43, so there has been only slight drop since admission.  He is  hemodynamically stable.   RECOMMENDATIONS:  1. H. Pylori serology needs to be set given remote history of peptic ulcer     disease and given as he will need chronic NSAID therapy.  If positive,     treating would reduce the risk of NSAID peptic ulcer disease.  2. Total colonoscopy to be performed in a.m.  He will be given prep tomorrow     morning.  I have reviewed the procedure and risks with the patient and he     is agreeable.   I would like to thank Dr. Ouida Sills for the opportunity to participate in the  care of this gentleman.                                                 Lionel December, M.D.    NR/MEDQ  D:  05/03/2002  T:  05/04/2002  Job:  [id number]

## 2010-07-08 NOTE — H&P (Signed)
NAME:  Logan Hunt, Logan Hunt                        ACCOUNT NO.:  [id number]   MEDICAL RECORD NO.:  [id number]                   PATIENT TYPE:  INP   LOCATION:  A337                                 FACILITY:  [facility]   PHYSICIAN:  Kingsley Callander. Ouida Sills, M.D.                  DATE OF BIRTH:  09/13/1955   DATE OF ADMISSION:  05/03/2002  DATE OF DISCHARGE:                                HISTORY & PHYSICAL   CHIEF COMPLAINT:  Rectal bleeding.   HISTORY OF PRESENT ILLNESS:  This patient is a 55 year old white male who  presented to the emergency room after being awakened from sleep at  approximately 4 a.m. with crampy lower abdominal pain.  He initially passed  loose dark stools, and then began experiencing bright red rectal bleeding.  He has a history of peptic ulcer disease back in the 70's.  He has recently  been on Prevacid.  He has also recently been on Diclofenac 75 mg b.i.d., and  a prophylactic aspirin 325 mg daily.  He routinely has normal bowel  movements daily.  He does not have constipation or diarrhea.  He has never  had a colonoscopy.  He is now pain-free.  He has felt lightheaded, but has  not experienced syncope or chest pain.   PAST MEDICAL HISTORY:  1. Peptic ulcer disease.  2. Hyperlipidemia.  3. Hypertension.  4. Chronic back pain.   MEDICATIONS:  1. Diclofenac 75 mg b.i.d.  2. Zetia 10 mg daily.  3. Aspirin 325 mg daily.  4. Zocor 20 mg daily.  5. Prevacid 30 mg daily.  6. Altace 10 mg daily.   ALLERGIES:  No known drug allergies.   SOCIAL HISTORY:  He smokes a pack per day.  He does not use alcohol or  drugs.  He is disabled from his back pain.  He is a former Product manager.   FAMILY HISTORY:  His father has had heart disease and diabetes.  His mother  has had lung cancer and breast cancer.   REVIEW OF SYMPTOMS:  No chest pain, shortness of breath, or difficulty  voiding.   PHYSICAL EXAMINATION:  VITAL SIGNS:  Temperature 97.2, blood pressure  151/88, heart  rate 71, respirations 18.  GENERAL:  Alert and oriented white male.  HEENT:  Eyes appear normal.  There is no scleral icterus.  The pharynx  reveals a few remaining teeth, no oral lesions.  NECK:  Supple without thyromegaly.  LUNGS:  Clear.  HEART:  Regular with no murmurs.  ABDOMEN:  Nontender with no hepatosplenomegaly.  RECTAL:  Digital rectal examination by Dr. Rosalia Hammers revealed blood.  EXTREMITIES:  No cyanosis, clubbing, or edema.  NEUROLOGIC:  Grossly intact.   LABORATORY DATA:  White count 9.2, hemoglobin 15.6, platelets 260.  INR  12.4.  Sodium 137, potassium 4.5, glucose 115, BUN 15, creatinine 1.3,  calcium 9.2.  Lipase 59, SGOT 26, SGPT  29, bilirubin 1.2.   IMPRESSION:  1. Gastrointestinal bleed.  He is being hospitalized for observation, GI     consultation, and treatment with IV Protonix.  Aspirin and Diclofenac     will be stopped.  His Altace will be held for now.  2. Hypertension.  3. Hyperlipidemia.  4. Chronic low back pain.                                               Kingsley Callander. Ouida Sills, M.D.    ROF/MEDQ  D:  05/03/2002  T:  05/03/2002  Job:  [id number]   cc:   Angus G. Renard Matter, M.D.  [address]  [place]  [place] [address]  Fax: [phone]   Tommye Standard, M.D.  [address] - Resident  [place], [place] [address]

## 2010-07-08 NOTE — Discharge Summary (Signed)
NAME:  KEYTON, BHAT                        ACCOUNT NO.:  [id number]   MEDICAL RECORD NO.:  [id number]                   PATIENT TYPE:  INP   LOCATION:  A320                                 FACILITY:  [facility]   PHYSICIAN:  Angus G. Renard Matter, M.D.              DATE OF BIRTH:  Oct 12, 1955   DATE OF ADMISSION:  08/19/2002  DATE OF DISCHARGE:  08/21/2002                                 DISCHARGE SUMMARY   A 55 year old white male admitted August 19, 2002; discharged August 21, 2002; 2  days hospitalization.   DIAGNOSES:  1. Transient ischemic attack.  2. History of hypertension.  3. History of coronary artery disease.  4. History of dyslipidemia.  5. History of amaurosis fugax.   CONDITION:  Stable at the time of his discharge.   A 55 year old white male was admitted after having been seen in the ED by ED  physician. The patient presented there with chief complaint being blurred  vision left eye, drawing sensation of the left side of his face, numbness of  the left side of his face, tingling sensation in his left arm.  The patient  had a CT which was negative for stroke. Apparently the patient had  experienced several episodes of this sort, prior to this episode and they  would last for a shorter period of time.  He denied any weakness of the left  arm and the leg.  It was recommended that he be admitted and placed on IV  heparin.  Workup to include echocardiogram, carotid Doppler, and ultrasound.   PHYSICAL EXAMINATION:  GENERAL: Examination revealed an alert, white male.  VITAL SIGNS: Blood pressure 116/64, pulse 68 and respirations 18,  temperature 98.  HEENT: Eyes PERRLA.  TMs negative.  Oropharynx benign.  NECK:  Supple.  No JVD or thyroid abnormalities.  LUNGS:  Clear to P&A.  HEART:  Regular rhythm.  No murmurs.  ABDOMEN: No palpable organs or masses.  EXTREMITIES: Free of edema.  NEUROLOGICAL: Cranial nerves intact.  No motor weakness in extremities.  Decreased sensation on  the left side of his face.   LABORATORY DATA:  Admission CBC: WBC 10,300, hemoglobin 15.1, hematocrit  43.9.  Chemistries: Sodium 136, potassium 3.8, chloride 106, CO2 28, glucose  114, BUN 14, creatinine 1.1, calcium 8.7, total protein 6.1, albumin 4.6.  Liver enzymes: SGOT 15, SGPT 18, alkaline phosphatase 68, total bilirubin  0.8, B12 level 410.  X-rays: CT of the head, normal CT.  Ultrasound carotid  duplex.  No evidence of significant plaque formation or hemodynamically  significant stenosis within the visualized portion of the common internal  carotid arteries.  MRI of the brain without contrast:  Stable white matter  hyperintensities.  No MRI findings of acute intracranial process.  No  intracranial mass lesions.  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  Electrocardiogram: Sinus rhythm, rate in  the 60s.   HOSPITAL COURSE:  The patient at the time of his admission was placed on a  regular diet.  IV half normal saline, KVO rate, vital signs and neuro checks  q.2h.  Nasal O2 at 2 liters per minute.  IV heparin was given in 5000 unit  bolus and he remained on IV heparin according to dosage calculated by  pharmacy.  The patient had carotid Doppler ultrasound which showed no  significant lesions.  Echocardiogram was essentially negative.  Neurology  consult with Dr. Gerilyn Pilgrim was requested.  He was continued on aspirin 325 mg  daily, Zocor 20 mg daily, Prevacid 30 mg daily, Altace 10 mg daily, Zetia 10  mg daily.  The patient showed improvement during his hospital stay.  EEG was  ordered by Dr. Gerilyn Pilgrim along with MRI of the brain.  Homocystine level and  methylmalonic acid level were requested.  He was placed on Plavix 75 mg  daily.  The patient showed improvement during his hospital stay.  Dr.  Gerilyn Pilgrim felt that he had multiple neurological symptoms raising the  possibility of multiple sclerosis.  He felt also  that this could be  transient ischemic attack.  The patient also experienced small, right  hemispheric infarct.  He improved during his hospital stay and was able to  be discharged after 2 days hospitalization.   DISCHARGE MEDICATIONS:  1. The patient was discharged on Plavix 75 mg daily.  2. Zocor 20 mg daily.  3. Zetia 10 mg daily.  4. Altace 10 mg daily.  5. Prevacid 30 mg daily.  6. Aspirin 325 mg daily.                                               Angus G. Renard Matter, M.D.    AGM/MEDQ  D:  09/24/2002  T:  09/24/2002  Job:  [id number]

## 2010-07-08 NOTE — Op Note (Signed)
NAME:  Logan Hunt, Logan Hunt                        ACCOUNT NO.:  [id number]   MEDICAL RECORD NO.:  [id number]                   PATIENT TYPE:  INP   LOCATION:  A337                                 FACILITY:  [facility]   PHYSICIAN:  Lionel December, M.D.                 DATE OF BIRTH:  April 15, 1955   DATE OF PROCEDURE:  05/04/2002  DATE OF DISCHARGE:  05/04/2002                                 OPERATIVE REPORT   PROCEDURE:  Total colonoscopy.   ENDOSCOPIST:  Lionel December, M.D.   INDICATIONS:  Karla is a 55 year old Caucasian male with rectal bleeding.  His presentation is suspicious for ischemic colitis.  The procedures were  reviewed with the patient and informed consent was obtained.   PREOPERATIVE MEDICATIONS:  Demerol 25 mg IV and Versed 5 mg IV in divided  dose.   INSTRUMENT:  Olympus video system.   FINDINGS:  Procedure performed in endoscopy suite.  The patient's vital  signs and O2 saturation were monitored during the procedure and remained  stable.  The patient was placed in the left lateral recumbent position and  rectal examination was performed.  He had a central skin tag.  Digital exam  was normal.   The scope was placed in the rectum and advanced under vision into the  sigmoid colon and beyond.  He had patchy erythema, friability a day erosions  at the proximal sigmoid colon, but most of these areas involved the  descending colon.  There was no active bleeding.  The mucosal changes were  superficial.  The mucosa past the splenic flexure was normal.   The scope was passed into the cecum which was identified by ileocecal valve  and appendiceal orifice.  As the scope was withdrawn colonic mucosa was once  again carefully examined and there were no polyps or tumor masses.  Biopsy  was taken from this inflamed mucosa of the descending colon.  The mucosa of  the distal sigmoid colon and rectum was normal.   The scope was retroflexed to examine the anorectal junction and  small  hemorrhoids were noted below the dentate line.  The endoscope was  straightened and withdrawn.  The patient tolerated the procedure well.   FINAL DIAGNOSES:  1. Patchy colitis involving the descending colon.  Endoscopic appearance     felt to be typical of ischemic colitis.  Biopsy taken.  2. Small external hemorrhoids.    RECOMMENDATIONS:  Once again, patient advised to quit cigarette smoking.  Since this is his first episode he does not need any further workup.  Will  advance his diet to a 4 gm sodium diet and use Levsin SL t.i.d. p.r.n. I do  not see any contraindications to him going home today.  Lionel December, M.D.    NR/MEDQ  D:  05/04/2002  T:  05/05/2002  Job:  [id number]   cc:   Angus G. Renard Matter, M.D.  [address]  [place]  [place] [address]  Fax: [phone]

## 2010-07-08 NOTE — Group Therapy Note (Signed)
   NAME:  Logan Hunt, Logan Hunt                        ACCOUNT NO.:  [id number]   MEDICAL RECORD NO.:  [id number]                   PATIENT TYPE:  INP   LOCATION:  A320                                 FACILITY:  [facility]   PHYSICIAN:  Angus G. Renard Matter, M.D.              DATE OF BIRTH:  Nov 06, 1955   DATE OF PROCEDURE:  DATE OF DISCHARGE:                                   PROGRESS NOTE   SUBJECTIVE:  This patient's condition remains stable.  He was admitted with  drawing sensation and numbness of the left side of his face, weakness of the  left hand.  He was felt to have a small right hemispheric infarct.   OBJECTIVE:  Vital signs:  Blood pressure 114/66, respirations 20, pulse 57,  temperature 97.5.  Heart:  Regular rhythm.  Lungs:  Clear to P&A.  Abdomen:  No palpable organs or masses.  Neurological:  The patient has numbness to  the left side of his face and weakness in grip of the left arm.   ASSESSMENT:  The patient was admitted with what was felt to be most likely  right hemispheric infarct.   PLAN:  To proceed with MRI.  His heparin was discontinued.  He was placed on  Plavix 75 mg daily.                                                Angus G. Renard Matter, M.D.    AGM/MEDQ  D:  08/20/2002  T:  08/20/2002  Job:  [id number]

## 2010-07-08 NOTE — H&P (Signed)
[place]  Patient:    JAISHAWN, WITZKE Visit Number: [id number] MRN: [id number]          Service Type: MED Location: [phone] Attending Physician:  Junious Silk Dictated by:   Madolyn Frieze. Jens Som, M.D. [facility] Admit Date:  05/30/2001                           History and Physical  HISTORY OF PRESENT ILLNESS:  Mr. Usery is a 55 year old gentleman with a past medical history of coronary artery disease (results unknown), hypertension, hyperlipidemia, gastroesophageal reflux disease who presents for evaluation of chest pain.  The patient states that he has had a prior intervention although the records are not available.  He is unclear of whether this occurred at [facility] or [place].  He also had a catheterization approximately five years ago that he states looked "okay."  He occasionally has a pinprick sensation in his left chest area.  However, this morning while drinking a cup of coffee, he developed a pressure in the left chest area. There was no associated nausea or vomiting and no diaphoresis, but there was shortness of breath.  The pain was not pleuritic or positional and not related to food.  It did not radiate.  The duration of the pain was approximately two-and-a-half hours and resolved with nitroglycerin at [facility] emergency room.  He was transferred for further management and he is presently pain free.  Of note, he denies any exertional chest pain over the past several years.  PRESENT MEDICATIONS:  Include Altace 10 mg p.o. q.d., aspirin, medications for arthritis, cholesterol, and question stomach medication.  ALLERGIES:  No known drug allergies.  PAST MEDICAL HISTORY:  Significant for hypertension, hyperlipidemia, but no diabetes mellitus.  He has a history of back problems.  There is a history of arthritis and hiatal hernia.  He has coronary artery disease as outlined above with the records  pending.  SOCIAL HISTORY:  He does smoke but does not consume alcohol.  He is disabled secondary to his back pain.  FAMILY HISTORY:  Positive for coronary artery disease in his father.  REVIEW OF SYSTEMS:  He denies any headaches or fevers or chills.  There is no productive cough or hemoptysis.  There is dysphagia, odynophagia, melena, or hematochezia.  There is no dysuria or hematuria.  There is no rash or seizure activity.  There is no orthopnea, PND, pedal edema, palpitations, presyncope, or syncope.  His remaining systems are negative.  PHYSICAL EXAMINATION TODAY:  VITAL SIGNS:  Blood pressure 113/70, pulse 60, he is afebrile, respiratory rate 20.  GENERAL:  He is well-developed and well-nourished in no acute distress.  SKIN:  Warm and dry.  HEENT:  Unremarkable with normal eyelids.  NECK:  Supple with a normal upstroke bilaterally and there are no bruits noted.  There is no jugular venous distention, no thyromegaly noted.  CHEST:  Clear to auscultation with normal expansion.  CARDIOVASCULAR:  Reveals a regular rate and rhythm with a normal S1 and S2. His heart sounds are distant.  I can appreciate no murmurs.  ABDOMEN:  Not tender or distended, positive bowel sounds.  No hepatosplenomegaly, no masses appreciated.  There is no abdominal bruit.  He has 2+ femoral pulses bilaterally and no bruits.  EXTREMITIES:  Show no edema.  I can palpate no cords.  He has 2+ dorsalis pedis pulses bilaterally.  NEUROLOGIC:  Grossly intact.  LABORATORY DATA:  His electrocardiogram shows normal sinus rhythm with no acute ST changes.  BUN and creatinine 18 and 1.1.  Hemoglobin and hematocrit are 16 and 45, white blood cell count 8.5.  Initial enzymes are negative.  Liver functions are normal with the exception of a mildly elevated SGPT of 42.  DIAGNOSES: 1. Atypical chest pain. 2. History of coronary artery disease. 3. Hypertension. 4. Hyperlipidemia. 5. Tobacco use. 6.  Gastroesophageal reflux disease.  PLAN:  Mr. Navarra presents for evaluation of chest pain.  His symptoms are not classic for coronary disease.  However, he does have multiple risk factors and a history of PCI.  We will plan to admit and rule out myocardial infarction with serial enzymes.  We will continue with his aspirin and we will add heparin and nitroglycerin as well as low-dose beta blockade.  We will check a fasting cholesterol panel tomorrow morning and initiate a statin as indicated. I have discussed the importance of discontinuing his tobacco use with him.  We will also obtain his old records concerning his history of PCI.  If his enzymes are negative and his ECG is unchanged in the morning then we will plan to proceed with a stress Cardiolite for risk stratification.  If his enzymes are positive we will plan catheterization. Dictated by:   Madolyn Frieze. Jens Som, M.D. [facility] Attending Physician:  Junious Silk DD:  05/30/01 TD:  05/30/01 Job: [id number]

## 2010-07-08 NOTE — Discharge Summary (Signed)
[place]  Patient:    Logan Hunt, Logan Hunt Visit Number: [id number] MRN: [id number]          Service Type: MED Location: [phone] Attending Physician:  Junious Silk Dictated by:   Pennelope Bracken, N.P. Admit Date:  05/30/2001 Discharge Date: 05/31/2001   CC:         Butch Penny, M.D.   Discharge Summary  CARDIOLOGIST:  Jesse Sans. Wall, M.D.  REASON FOR ADMISSION:  Transfer from [facility] for chest pain.  DISCHARGE DIAGNOSES: 1. Chest pain, etiology unclear. 2. History of three previous cardiac catheterizations with no flow-limiting    coronary artery disease identified. 3. History of prior gated Cardiolite study three years ago showing no evidence    of ischemia. 4. Hypertension. 5. Hyperlipidemia. 6. Continued tobacco abuse.  HISTORY OF PRESENT ILLNESS:  This delightful 55 year old gentleman from [place] has been seen several times for investigation of atypical chest pain.  He was transferred here from [facility] after describing a two-month history of left-sided chest pain that felt like a sharp prick, with episodes lasting several seconds.  He had also described some left-sided chest pressure with dyspnea over a three-hour time period prior to admission.  He was given nitroglycerin at [facility] with reduction in pain.  Given his multiple risk factors, it was elected prudent to admit him here for further evaluation.  HOSPITAL COURSE:  The patient was admitted and continued on his home medications.  He experienced no further chest pain.  The patients cardiac enzymes were obtained x3, and these were negative.  We attempted to schedule the patient for a stress Cardiolite but due to scheduling concerns, this will be performed as an outpatient at [facility].  Day 2 of admission Dr. Jens Som adjudged the patient to be appropriate for discharge.  DISCHARGE PHYSICAL EXAMINATION:  VITAL SIGNS:  110/60, 60, 20, 96% on 2  L.  GENERAL:  The patient offered no complaints of chest pain, dyspnea, chest tightness.  CHEST:  Clear to auscultation.  CARDIAC:  Regular rate and rhythm without murmur, rub, or gallop.  EXTREMITIES:  No clubbing, cyanosis, or edema.  LABORATORY DATA:  EKG showed mild diffuse ST elevation consistent with pericarditis.  Chest x-ray on admission revealed no active disease.  CBC at discharge revealed WBC of 10.9, hemoglobin 14.6, hematocrit 41.6, platelets 232.  Chemistry on admission reveals a sodium of 138, potassium 4.5, chloride 107, CO2 24, BUN 18, creatinine 1.1, glucose 104.  DISPOSITION:  The patient is discharged to home on his preadmission medicines, which are as follows:  DISCHARGE MEDICATIONS: 1. Aspirin 325 mg one q.d. 2. Altace 10 mg one q.d. 3. Zetia 10 mg one q.d. 4. Diclofenac 75 mg one b.i.d. 5. Zocor 20 mg one q.d.  DISCHARGE INSTRUCTIONS:  Activity is as tolerated.  The patient is advised to walk 30 minutes a day.  Diet:  Low-fat, low-salt, low-cholesterol diet is advised.  SPECIAL INSTRUCTIONS:  The patient is sternly advised to quit smoking, and he agrees to try.  French Ana will call from [facility] to schedule a treadmill Cardiolite test early next week.  Follow-up will be with his primary care physician, Dr. Frederic Jericho, in one weeks time.  The patient is to call in the interim if any problems, questions, or concerns, or change or increase in symptoms. Dictated by:   Pennelope Bracken, N.P. Attending Physician:  Junious Silk DD:  05/31/01 TD:  05/31/01 Job: [id number]

## 2010-07-08 NOTE — Cardiovascular Report (Signed)
NAME:  Logan Hunt, Logan Hunt                        ACCOUNT NO.:  [id number]   MEDICAL RECORD NO.:  [id number]                   PATIENT TYPE:  OIB   LOCATION:  6501                                 FACILITY:  [facility]   PHYSICIAN:  Charlton Haws, M.D.                  DATE OF BIRTH:  Jun 24, 1955   DATE OF PROCEDURE:  08/27/2003  DATE OF DISCHARGE:                              CARDIAC CATHETERIZATION   PROCEDURE:  Coronary arteriography.   INDICATIONS:  Recurrent chest pain with Cardiolite study suggesting small  area of mild inferior wall ischemia.   Cine catheterization was done from the right femoral artery using 4 French  sheath.  JR4 and JL4 catheters were used.  The patient tolerated the  procedure well.   1. The left main coronary artery was normal.  2. The left anterior descending artery had 30% multiple discrete lesions in     the distal vessel, and the first diagonal branch had 30% multiple     discrete lesions.  3. The circumflex coronary artery was nondominant and normal.  4. The right coronary artery was dominant.  There was 30% tubular disease in     the distal vessel.   RAO ventriculography:  RAO ventriculography was normal.  There was no  evidence of wall motion abnormality.  There was no gradient across the  aortic valve and no MR.  Ejection fraction was in the 60% range.   Aortic pressure was 123/72, LV pressure was 120/14.   IMPRESSION:  The patient does not have critical coronary artery disease.  I  suspect he had a false positive Cardiolite study.  It was read as low-risk.   I suspect the patient's chest tightness has more to do with his asthma and  lung problems.  He also seems to get relief with his inhaler.  He will  follow up with Dr. Butch Penny in regard to other noncardiac etiologies to  his chest pain and further therapy of his lung disease.   I will have him follow up with Dr. Dorethea Clan in three to four weeks just to  make sure his groin has healed well and  to address any other possible risk  factors or workup of noncardiac etiology to his chest pain.                                               Charlton Haws, M.D.    PN/MEDQ  D:  08/27/2003  T:  08/28/2003  Job:  [id number]   cc:   Angus G. Renard Matter, M.D.  [address]  [place]  [place] [address]  Fax: [phone]   Vida Roller, M.D.  Fax: [phone]

## 2010-07-08 NOTE — Procedures (Signed)
   NAME:  Logan Hunt, Logan Hunt.                       ACCOUNT NO.:  [id number]   MEDICAL RECORD NO.:  [id number]                    PATIENT TYPE:  PINP   LOCATION:                                       FACILITY:   PHYSICIAN:  Thomas C. Wall, M.D.                DATE OF BIRTH:   DATE OF PROCEDURE:  DATE OF DISCHARGE:                                  ECHOCARDIOGRAM   INDICATIONS:  429.2   The echocardiogram was of adequate technically quality.   CONCLUSIONS:  1. Mild-to-moderate left atrial enlargement.  2. Normal left ventricular chamber size and overall systolic function.     There may be some inferior basilar hypokinesia.  Overall ejection     fraction is 55%.  3. Disproportion upper septal wall thickness with no outflow tract     obstruction.  4. No valvular abnormalities.  5. Normal right-sided structures and function.  6. No pericardial effusion.                                               Thomas C. Wall, M.D.    TCW/MEDQ  D:  08/20/2002  T:  08/20/2002  Job:  [id number]   cc:   Angus G. Renard Matter, M.D.  [address]  [place]  [place] [address]  Fax: [phone]

## 2010-07-08 NOTE — Group Therapy Note (Signed)
NAME:  JERMANY, RIMEL NO.:  [id number]   MEDICAL RECORD NO.:  [id number]          PATIENT TYPE:  EMS   LOCATION:  ED                            FACILITY:  [facility]   PHYSICIAN:  Kingsley Callander. Ouida Sills, MD       DATE OF BIRTH:  Sep 06, 1955   DATE OF PROCEDURE:  DATE OF DISCHARGE:  03/27/2004                                   PROGRESS NOTE   HISTORY OF PRESENT ILLNESS:  This patient is a 55 year old white male who  presented to the emergency room complaining of right ear ache and sore  throat.  He had been seen earlier this week in the emergency room and had  been treated with Floxin ear drops and Auralgan drops.  His right ear feels  better, but his throat is hurting worse.  He has had minimal cough.  He is a  smoker.  He is not having purulent sputum production.  He has had minimal  sinus drainage.   PAST MEDICAL HISTORY:  1.  Myocardial infarction.  2.  Stroke.  3.  Hiatal hernia.  4.  Hypertension.  5.  GERD.  6.  Hypercholesterolemia.   MEDICATIONS:  1.  Lipitor 20 mg daily.  2.  Prevacid 30 mg daily.  3.  Plavix 75 mg daily.  4.  Altace 10 mg daily.  5.  Aspirin 325 mg daily.   ALLERGIES:  No known drug allergies.   PHYSICAL EXAMINATION:  VITAL SIGNS:  Temperature 97.2, pulse 70,  respirations 20, blood pressure 127/78.  GENERAL:  In no acute distress.  HEENT:  The right TM appears red.  He has open canals bilaterally.  Nose is  unremarkable.  Pharynx reveals some mild redness of his uvula and posterior  pharynx, but no exudate.  There is no cervical adenopathy.  LUNGS:  Clear with slightly diminished breath sounds.  HEART:  Regular with no murmurs.  EXTREMITIES:  No edema.   IMPRESSION:  Right otitis media/pharyngitis.   PLAN:  1.  Add Amoxil 875 mg b.i.d. x10 days.  2.  Follow up with Dr. Renard Matter if symptoms are unresolved.      ROF/MEDQ  D:  03/27/2004  T:  03/27/2004  Job:  [id number]   cc:   Angus G. Renard Matter, MD  [address]   [place]  [place] [address]  Fax: [phone]

## 2010-07-08 NOTE — Discharge Summary (Signed)
   NAME:  Logan Hunt, Logan Hunt                        ACCOUNT NO.:  [id number]   MEDICAL RECORD NO.:  [id number]                   PATIENT TYPE:  INP   LOCATION:  A337                                 FACILITY:  [facility]   PHYSICIAN:  Kingsley Callander. Ouida Sills, M.D.                  DATE OF BIRTH:  1955-04-06   DATE OF ADMISSION:  05/03/2002  DATE OF DISCHARGE:  05/04/2002                                 DISCHARGE SUMMARY   DISCHARGE DIAGNOSES:  1. Ischemic colitis.  2. History of peptic ulcer disease.  3. Hypertension.  4. Hyperlipidemia.  5. Chronic back pain.   HOSPITAL COURSE:  This patient is a 55 year old male who presented to the  emergency room after awaking with crampy lower abdominal pain followed by  bloody stools.  He had recently taken diclofenac and a prophylactic aspirin.  He had a remote history of peptic ulcer disease.  His hemoglobin was 15.6.  He was hemodynamically stable.  He was hospitalized and seen in GI  consultation by Dr. Karilyn Cota.  He underwent a total colonoscopy which revealed  changes of ischemic colitis.  A biopsy was taken.  His repeat hemoglobin was  14.9.   With his history of peptic ulcer disease, an H. pylori serology was checked  and was normal at 0.83.   He was treated with Levsin three times daily.   He was stable for discharge on the afternoon of May 04, 2002.  He was  advised to discontinue all tobacco use.   His biopsy revealed benign colonic mucosa with mild increase in chronic  inflammatory cells.  Evidence of ischemic changes were not identified.   DISCHARGE MEDICATIONS:  1. Zetia 10 mg daily.  2. Aspirin 325 mg daily.  3. Zocor 20 mg daily.  4.     Altace 10 mg daily.  5. Prevacid 30 mg daily.   FOLLOW-UP:  The patient will see Dr. Renard Matter in a week.                                               Kingsley Callander. Ouida Sills, M.D.    ROF/MEDQ  D:  05/18/2002  T:  05/18/2002  Job:  [id number]   cc:   Lionel December, M.D.  [address]  [place]  [place]  [address]  Fax: [phone]

## 2010-08-25 ENCOUNTER — Encounter: Payer: Self-pay | Admitting: Cardiology

## 2011-11-03 ENCOUNTER — Ambulatory Visit (INDEPENDENT_AMBULATORY_CARE_PROVIDER_SITE_OTHER): Payer: Medicaid Other | Admitting: Cardiology

## 2011-11-03 ENCOUNTER — Encounter: Payer: Self-pay | Admitting: Cardiology

## 2011-11-03 VITALS — BP 122/70 | HR 64 | Ht 72.0 in | Wt 292.0 lb

## 2011-11-03 DIAGNOSIS — I779 Disorder of arteries and arterioles, unspecified: Secondary | ICD-10-CM

## 2011-11-03 DIAGNOSIS — I2510 Atherosclerotic heart disease of native coronary artery without angina pectoris: Secondary | ICD-10-CM

## 2011-11-03 NOTE — Progress Notes (Signed)
   HPI The patient presents for followup of nonobstructive coronary disease and cardiovascular risk factors. He has also had carotid plaque in the past. Since last being seen he has done well. The patient denies any new symptoms such as chest discomfort, neck or arm discomfort. There has been no new shortness of breath, PND or orthopnea. There have been no reported palpitations, presyncope or syncope. He is active in his yard but he doesn't exercise routinely.  No Known Allergies  Current Outpatient Prescriptions  Medication Sig Dispense Refill  . cyclobenzaprine (FLEXERIL) 10 MG tablet Take 10 mg by mouth as needed.       Marland Kitchen lisinopril (PRINIVIL,ZESTRIL) 10 MG tablet Take 10 mg by mouth daily.        Marland Kitchen lovastatin (MEVACOR) 40 MG tablet Take 80 mg by mouth daily.        Marland Kitchen omeprazole (PRILOSEC) 20 MG capsule Take 20 mg by mouth 2 (two) times daily.        . Tamsulosin HCl (FLOMAX) 0.4 MG CAPS Take 0.4 mg by mouth daily after supper.        Past Medical History  Diagnosis Date  . Hypertension   . CAD (coronary artery disease)     native vessel  . DJD (degenerative joint disease)   . TIA (transient ischemic attack)   . Carotid artery disease     No past surgical history on file.  ROS:  As stated in the HPI and negative for all other systems.  PHYSICAL EXAM BP 122/70  Pulse 64  Ht 6' (1.829 m)  Wt 292 lb (132.45 kg)  BMI 39.60 kg/m2 GENERAL:  Well appearing HEENT:  Pupils equal round and reactive, fundi not visualized, oral mucosa unremarkable, dentures NECK:  No jugular venous distention, waveform within normal limits, carotid upstroke brisk and symmetric, no bruits, no thyromegaly LYMPHATICS:  No cervical, inguinal adenopathy LUNGS:  Clear to auscultation bilaterally BACK:  No CVA tenderness CHEST:  Unremarkable HEART:  PMI not displaced or sustained,S1 and S2 within normal limits, no S3, no S4, no clicks, no rubs, no murmurs ABD:  Flat, positive bowel sounds normal in  frequency in pitch, no bruits, no rebound, no guarding, no midline pulsatile mass, no hepatomegaly, no splenomegaly, obese EXT:  2 plus pulses throughout, no edema, no cyanosis no clubbing SKIN:  No rashes no nodules NEURO:  Cranial nerves II through XII grossly intact, motor grossly intact throughout PSYCH:  Cognitively intact, oriented to person place and time   EKG:  Sinus rhythm, rate 64, axis within normal limits, intervals within normal limits, no acute ST-T wave changes.  11/03/2011   ASSESSMENT AND PLAN  CAD - This was mild nonobstructive disease. He's had no new symptoms since his last perfusion study. No further workup is suggested and he will continue with risk reduction.  Obesity - The patient understands the need to lose weight with diet and exercise. We have discussed specific strategies for this.  Carotid stenosis - He had mild plaque on years ago on Doppler and I will followup with this.  HTN - The blood pressure is at target. No change in medications is indicated. We will continue with therapeutic lifestyle changes (TLC).  Dyslipidemia - Per SHAH,ASHISH, MD with a goal LDL less than 100 and HDL greater than 40.

## 2011-11-03 NOTE — Patient Instructions (Addendum)
Your physician recommends that you schedule a follow-up appointment in: 1 year. You will receive a reminder letter in the mail in about 10 months reminding you to call and schedule your appointment. If you don't receive this letter, please contact our office. Your physician recommends that you continue on your current medications as directed. Please refer to the Current Medication list given to you today. Your physician has requested that you have a carotid duplex. This test is an ultrasound of the carotid arteries in your neck. It looks at blood flow through these arteries that supply the brain with blood. Allow one hour for this exam. There are no restrictions or special instructions.  

## 2011-11-16 ENCOUNTER — Encounter: Payer: Self-pay | Admitting: Physician Assistant

## 2011-11-22 DIAGNOSIS — R079 Chest pain, unspecified: Secondary | ICD-10-CM

## 2011-11-23 DIAGNOSIS — R079 Chest pain, unspecified: Secondary | ICD-10-CM

## 2011-11-24 DIAGNOSIS — R55 Syncope and collapse: Secondary | ICD-10-CM

## 2011-11-29 ENCOUNTER — Encounter (INDEPENDENT_AMBULATORY_CARE_PROVIDER_SITE_OTHER): Payer: Medicare Other

## 2011-11-29 DIAGNOSIS — I6529 Occlusion and stenosis of unspecified carotid artery: Secondary | ICD-10-CM

## 2011-12-06 ENCOUNTER — Ambulatory Visit (INDEPENDENT_AMBULATORY_CARE_PROVIDER_SITE_OTHER): Payer: Medicare Other | Admitting: Cardiology

## 2011-12-06 ENCOUNTER — Encounter: Payer: Self-pay | Admitting: Cardiology

## 2011-12-06 VITALS — BP 126/82 | HR 61 | Ht 72.0 in | Wt 303.1 lb

## 2011-12-06 DIAGNOSIS — I2510 Atherosclerotic heart disease of native coronary artery without angina pectoris: Secondary | ICD-10-CM

## 2011-12-06 NOTE — Progress Notes (Signed)
HPI The patient presents for followup of syncope. He was recently hospitalized for this. I reviewed these hospital records. He had a nuclear study which was somewhat suboptimal but did not suggest high risk findings of ischemia or infarct. He did rule out for myocardial infarction with negative enzymes. He also had an echocardiogram which I reviewed which demonstrated a well preserved ejection fraction and no significant findings. The patient syncope happened at return from a window looking outside. He went down suddenly without prodrome and his wife thinks he was out for about 5 minutes. Since that time he's not had any further symptoms. He has had no presyncope or syncope. He has had no palpitations. He did have an episode of chest discomfort prior to passing out but has had none since then. He does his usual activities. He denies any chest pressure, neck or arm discomfort. He's had no new shortness of breath, PND or orthopnea.   No Known Allergies  Current Outpatient Prescriptions  Medication Sig Dispense Refill  . cyclobenzaprine (FLEXERIL) 10 MG tablet Take 10 mg by mouth as needed.       Marland Kitchen lisinopril (PRINIVIL,ZESTRIL) 10 MG tablet Take 10 mg by mouth daily.        Marland Kitchen lovastatin (MEVACOR) 40 MG tablet Take 80 mg by mouth daily.        Marland Kitchen omeprazole (PRILOSEC) 20 MG capsule Take 20 mg by mouth 2 (two) times daily.        . Tamsulosin HCl (FLOMAX) 0.4 MG CAPS Take 0.4 mg by mouth daily after supper.        Past Medical History  Diagnosis Date  . Hypertension   . CAD (coronary artery disease)     Mild plaque 2005  . DJD (degenerative joint disease)   . TIA (transient ischemic attack)   . Carotid artery disease     Past Surgical History  Procedure Date  . None     ROS:  As stated in the HPI and negative for all other systems.  PHYSICAL EXAM BP 126/82  Pulse 61  Ht 6' (1.829 m)  Wt 303 lb 1.9 oz (137.494 kg)  BMI 41.11 kg/m2  SpO2 97% GENERAL:  Well appearing HEENT:  Pupils  equal round and reactive, fundi not visualized, oral mucosa unremarkable, dentures NECK:  No jugular venous distention, waveform within normal limits, carotid upstroke brisk and symmetric, no bruits, no thyromegaly LYMPHATICS:  No cervical, inguinal adenopathy LUNGS:  Clear to auscultation bilaterally BACK:  No CVA tenderness CHEST:  Unremarkable HEART:  PMI not displaced or sustained,S1 and S2 within normal limits, no S3, no S4, no clicks, no rubs, no murmurs ABD:  Flat, positive bowel sounds normal in frequency in pitch, no bruits, no rebound, no guarding, no midline pulsatile mass, no hepatomegaly, no splenomegaly, obese EXT:  2 plus pulses throughout, no edema, no cyanosis no clubbing SKIN:  No rashes no nodules NEURO:  Cranial nerves II through XII grossly intact, motor grossly intact throughout PSYCH:  Cognitively intact, oriented to person place and time   EKG:  Sinus rhythm, rate 64, axis within normal limits, intervals within normal limits, no acute ST-T wave changes.  12/06/2011   ASSESSMENT AND PLAN  Syncope - The etiology of this is not clear despite an extensive hospital evaluation. He's had no further events. I don't think event monitoring would be helpful. He will let he know if this type of event or presyncope occurs again.  CAD - This was mild nonobstructive  disease in 2005. A stress perfusion study as somewhat suboptimal images recently but there were no high-risk findings. No further workup is suggested and he will continue with risk reduction.  Obesity - The patient understands the need to lose weight with diet and exercise. We have discussed specific strategies for this.  Carotid stenosis - He had mild carotid plaque on Doppler 11/29/11 and will have followup in 2 years.  HTN - The blood pressure is at target. No change in medications is indicated. We will continue with therapeutic lifestyle changes (TLC).

## 2011-12-06 NOTE — Patient Instructions (Addendum)

## 2012-05-28 ENCOUNTER — Ambulatory Visit: Payer: Medicare Other | Admitting: Cardiology

## 2012-06-05 ENCOUNTER — Ambulatory Visit (INDEPENDENT_AMBULATORY_CARE_PROVIDER_SITE_OTHER): Payer: Medicare Other | Admitting: Physician Assistant

## 2012-06-05 ENCOUNTER — Encounter: Payer: Self-pay | Admitting: Physician Assistant

## 2012-06-05 VITALS — BP 133/69 | HR 74 | Ht 72.0 in | Wt 286.8 lb

## 2012-06-05 DIAGNOSIS — R55 Syncope and collapse: Secondary | ICD-10-CM | POA: Insufficient documentation

## 2012-06-05 DIAGNOSIS — I779 Disorder of arteries and arterioles, unspecified: Secondary | ICD-10-CM | POA: Insufficient documentation

## 2012-06-05 DIAGNOSIS — E785 Hyperlipidemia, unspecified: Secondary | ICD-10-CM | POA: Insufficient documentation

## 2012-06-05 DIAGNOSIS — I10 Essential (primary) hypertension: Secondary | ICD-10-CM | POA: Insufficient documentation

## 2012-06-05 DIAGNOSIS — I2510 Atherosclerotic heart disease of native coronary artery without angina pectoris: Secondary | ICD-10-CM | POA: Insufficient documentation

## 2012-06-05 MED ORDER — ASPIRIN EC 81 MG PO TBEC: 81.0000 mg | DELAYED_RELEASE_TABLET | Freq: Every day | ORAL | Status: DC

## 2012-06-05 NOTE — Assessment & Plan Note (Signed)
On lovastatin, followed by primary M.D. Recommend target LDL 100 or less, if feasible.

## 2012-06-05 NOTE — Assessment & Plan Note (Signed)
Stable on current medication regimen 

## 2012-06-05 NOTE — Assessment & Plan Note (Signed)
Mild disease by Doppler study 11/2011, with recommended followup in 2 years.

## 2012-06-05 NOTE — Assessment & Plan Note (Signed)
Continue risk factor modification. Will add ASA 81 mg for primary prevention. Patient reports history of easy bruisability, for which I instructed him to continue monitoring closely.

## 2012-06-05 NOTE — Assessment & Plan Note (Signed)
Status post singular episode, with no recurrence. History of normal LVF.

## 2012-06-05 NOTE — Patient Instructions (Addendum)
   Begin Aspirin 81mg daily Continue all other current medications. Your physician wants you to follow up in:  1 year.  You will receive a reminder letter in the mail one-two months in advance.  If you don't receive a letter, please call our office to schedule the follow up appointment   

## 2012-06-05 NOTE — Progress Notes (Signed)
Primary Cardiologist: Rollene Rotunda, MD   HPI: Scheduled six-month followup.  He reports no recurrent syncope. He denies any interim development of exertional CP or DOE. He denies tachycardia palpitations. He has lost 17 pounds since last OV, and is feeling much better with renewed energy.  Patient quit smoking approximately 5 years ago.  No Known Allergies  Current Outpatient Prescriptions  Medication Sig Dispense Refill  . cyclobenzaprine (FLEXERIL) 10 MG tablet Take 10 mg by mouth as needed.       Marland Kitchen lisinopril (PRINIVIL,ZESTRIL) 10 MG tablet Take 10 mg by mouth daily.        Marland Kitchen lovastatin (MEVACOR) 40 MG tablet Take 80 mg by mouth daily.        Marland Kitchen omeprazole (PRILOSEC) 20 MG capsule Take 20 mg by mouth 2 (two) times daily.        . Tamsulosin HCl (FLOMAX) 0.4 MG CAPS Take 0.4 mg by mouth daily after supper.      Marland Kitchen aspirin EC 81 MG tablet Take 1 tablet (81 mg total) by mouth daily.       No current facility-administered medications for this visit.    Past Medical History  Diagnosis Date  . Hypertension   . CAD (coronary artery disease)     Mild plaque 2005  . DJD (degenerative joint disease)   . TIA (transient ischemic attack)   . Carotid artery disease   . Syncope   . HLD (hyperlipidemia)     Past Surgical History  Procedure Laterality Date  . None      History   Social History  . Marital Status: Married    Spouse Name: N/A    Number of Children: N/A  . Years of Education: N/A   Occupational History  . Disabled    Social History Main Topics  . Smoking status: Former Smoker    Start date: 02/21/2007  . Smokeless tobacco: Not on file  . Alcohol Use: No  . Drug Use: No  . Sexually Active: Not on file   Other Topics Concern  . Not on file   Social History Narrative   Regular exercise: Yes    No family history on file.  ROS: no nausea, vomiting; no fever, chills; no melena, hematochezia; no claudication  PHYSICAL EXAM: BP 133/69  Pulse 74  Ht 6'  (1.829 m)  Wt 286 lb 12.8 oz (130.092 kg)  BMI 38.89 kg/m2  SpO2 99% GENERAL: ; NAD HEENT: NCAT, PERRLA, EOMI; sclera clear; no xanthelasma NECK: palpable bilateral carotid pulses, no bruits; no JVD; no TM LUNGS: Diminished breath sounds, no crackles or wheezes CARDIAC: RRR (S1, S2); no significant murmurs; no rubs or gallops ABDOMEN: soft, protuberant EXTREMETIES: no significant peripheral edema SKIN: warm/dry; no obvious rash/lesions MUSCULOSKELETAL: no joint deformity NEURO: no focal deficit; NL affect   EKG:    ASSESSMENT & PLAN:  CAD, NATIVE VESSEL Continue risk factor modification. Will add ASA 81 mg for primary prevention. Patient reports history of easy bruisability, for which I instructed him to continue monitoring closely.  Syncope Status post singular episode, with no recurrence. History of normal LVF.  Hypertension Stable on current medication regimen  HLD (hyperlipidemia) On lovastatin, followed by primary M.D. Recommend target LDL 100 or less, if feasible.  Carotid artery disease Mild disease by Doppler study 11/2011, with recommended followup in 2 years.    Gene Aziah Kaiser, PAC [place]

## 2013-05-28 ENCOUNTER — Ambulatory Visit (INDEPENDENT_AMBULATORY_CARE_PROVIDER_SITE_OTHER): Payer: Medicare Other | Admitting: Cardiovascular Disease

## 2013-05-28 ENCOUNTER — Encounter: Payer: Self-pay | Admitting: Cardiovascular Disease

## 2013-05-28 VITALS — BP 135/80 | HR 66 | Ht 72.0 in | Wt 310.0 lb

## 2013-05-28 DIAGNOSIS — I739 Peripheral vascular disease, unspecified: Secondary | ICD-10-CM

## 2013-05-28 DIAGNOSIS — I10 Essential (primary) hypertension: Secondary | ICD-10-CM

## 2013-05-28 DIAGNOSIS — I2510 Atherosclerotic heart disease of native coronary artery without angina pectoris: Secondary | ICD-10-CM

## 2013-05-28 DIAGNOSIS — I779 Disorder of arteries and arterioles, unspecified: Secondary | ICD-10-CM

## 2013-05-28 DIAGNOSIS — F529 Unspecified sexual dysfunction not due to a substance or known physiological condition: Secondary | ICD-10-CM

## 2013-05-28 DIAGNOSIS — E785 Hyperlipidemia, unspecified: Secondary | ICD-10-CM

## 2013-05-28 DIAGNOSIS — R37 Sexual dysfunction, unspecified: Secondary | ICD-10-CM

## 2013-05-28 DIAGNOSIS — R55 Syncope and collapse: Secondary | ICD-10-CM

## 2013-05-28 MED ORDER — SILDENAFIL CITRATE 50 MG PO TABS: 50.0000 mg | ORAL_TABLET | Freq: Every day | ORAL | Status: DC | PRN

## 2013-05-28 NOTE — Patient Instructions (Signed)
Your physician recommends that you schedule a follow-up appointment in: 1 year. You will receive a reminder letter in the mail in about 10 months reminding you to call and schedule your appointment. If you don't receive this letter, please contact our office. Your physician recommends that you continue on your current medications as directed. Please refer to the Current Medication list given to you today. Your physician sent a prescription for viagra to your pharmacy today to be used daily as needed.

## 2013-05-28 NOTE — Progress Notes (Signed)
Patient ID: Logan Hunt, male   DOB: 05-10-1955, 58 y.o.   MRN: [id number]      SUBJECTIVE: The patient is a 58 year old male with a history of mild nonobstructive coronary artery disease as per cardiac catheterization performed in 2005. He underwent a low risk nuclear stress test in 2013. He was most recently evaluated in this office in April 2014. He also has a history of TIA and carotid artery disease.  ECG today shows normal sinus rhythm, heart rate 69 beats per minute. The patient denies any symptoms of chest pain, palpitations, shortness of breath, lightheadedness, dizziness, leg swelling, orthopnea, PND, and syncope.  He is interested in something for sexual dysfunction.  No Known Allergies  Current Outpatient Prescriptions  Medication Sig Dispense Refill  . aspirin EC 81 MG tablet Take 1 tablet (81 mg total) by mouth daily.      Marland Kitchen lisinopril (PRINIVIL,ZESTRIL) 10 MG tablet Take 10 mg by mouth daily.        Marland Kitchen lovastatin (MEVACOR) 40 MG tablet Take 80 mg by mouth at bedtime.       Marland Kitchen omeprazole (PRILOSEC) 20 MG capsule Take 20 mg by mouth 2 (two) times daily.         No current facility-administered medications for this visit.    Past Medical History  Diagnosis Date  . Hypertension   . CAD (coronary artery disease)     Mild plaque 2005  . DJD (degenerative joint disease)   . TIA (transient ischemic attack)   . Carotid artery disease   . Syncope   . HLD (hyperlipidemia)     Past Surgical History  Procedure Laterality Date  . None      History   Social History  . Marital Status: Married    Spouse Name: N/A    Number of Children: N/A  . Years of Education: N/A   Occupational History  . Disabled    Social History Main Topics  . Smoking status: Former Smoker    Start date: 02/21/2007  . Smokeless tobacco: Never Used  . Alcohol Use: No  . Drug Use: No  . Sexual Activity: Not on file   Other Topics Concern  . Not on file   Social History Narrative   Regular exercise: Yes     Filed Vitals:   05/28/13 0944  BP: 135/80  Pulse: 66  Height: 6' (1.829 m)  Weight: 310 lb (140.615 kg)    PHYSICAL EXAM General: NAD Neck: No JVD, no thyromegaly. Lungs: Clear to auscultation bilaterally with normal respiratory effort. CV: Nondisplaced PMI.  Regular rate and rhythm, normal S1/S2, no S3/S4, no murmur. No pretibial or periankle edema.  No carotid bruit.  Normal pedal pulses.  Abdomen: Soft, nontender, no hepatosplenomegaly, no distention.  Neurologic: Alert and oriented x 3.  Psych: Normal affect. Extremities: No clubbing or cyanosis.   ECG: reviewed and available in electronic records.      ASSESSMENT AND PLAN:  CAD, NATIVE VESSEL  Continue risk factor modification. Will continue ASA 81 mg.  Syncope  Noted to have one prior episode with no recurrence. History of normal LV function.   Hypertension  Stable on current medication regimen.  HLD (hyperlipidemia)  On lovastatin, followed by primary M.D. Recommend target LDL 100 or less, if feasible.   Carotid artery disease  Mild disease by Doppler study 11/2011, with recommended followup in 1 year.  Sexual dysfunction Will prescribe Viagra prn.  Dispo: f/u 1 year.   Kate Sable, M.D.,  F.A.C.C.

## 2013-05-29 ENCOUNTER — Telehealth: Payer: Self-pay | Admitting: *Deleted

## 2013-05-29 MED ORDER — SILDENAFIL CITRATE 20 MG PO TABS: 40.0000 mg | ORAL_TABLET | Freq: Every day | ORAL | Status: DC | PRN

## 2013-05-29 NOTE — Telephone Encounter (Signed)
New prescription sent to pharmacy. Per pharmacist, patient could get a #90 day supply for $100

## 2014-07-24 ENCOUNTER — Encounter: Payer: Self-pay | Admitting: *Deleted

## 2014-07-28 ENCOUNTER — Encounter: Payer: Self-pay | Admitting: Cardiovascular Disease

## 2014-07-28 ENCOUNTER — Ambulatory Visit (INDEPENDENT_AMBULATORY_CARE_PROVIDER_SITE_OTHER): Payer: Medicare Other | Admitting: Cardiovascular Disease

## 2014-07-28 VITALS — BP 138/72 | HR 69 | Ht 73.0 in | Wt 326.0 lb

## 2014-07-28 DIAGNOSIS — E785 Hyperlipidemia, unspecified: Secondary | ICD-10-CM | POA: Diagnosis not present

## 2014-07-28 DIAGNOSIS — I739 Peripheral vascular disease, unspecified: Secondary | ICD-10-CM

## 2014-07-28 DIAGNOSIS — I779 Disorder of arteries and arterioles, unspecified: Secondary | ICD-10-CM | POA: Diagnosis not present

## 2014-07-28 DIAGNOSIS — I2583 Coronary atherosclerosis due to lipid rich plaque: Principal | ICD-10-CM

## 2014-07-28 DIAGNOSIS — I10 Essential (primary) hypertension: Secondary | ICD-10-CM

## 2014-07-28 DIAGNOSIS — I2510 Atherosclerotic heart disease of native coronary artery without angina pectoris: Secondary | ICD-10-CM | POA: Diagnosis not present

## 2014-07-28 NOTE — Progress Notes (Signed)
Patient ID: Logan Hunt, male   DOB: Sep 27, 1955, 59 y.o.   MRN: [id number]      SUBJECTIVE: The patient presents for routine follow-up. He has a history of mild nonobstructive coronary artery disease as per cardiac catheterization performed in 2005. He underwent a low risk nuclear stress test in 2013. He also has a history of TIA and carotid artery disease.  He denies any symptoms of palpitations, shortness of breath, lightheadedness, dizziness, leg swelling, orthopnea, PND, and syncope. He very seldom gets fleeting chest pains which spontaneously resolve. He stays active weeding and building decks without any limitation in daily activities.  ECG performed in the office today demonstrates normal sinus rhythm, heart rate 64 bpm, with no ischemic ST segment or T-wave abnormalities, nor any arrhythmias.  Review of Systems: As per "subjective", otherwise negative.  No Known Allergies  Current Outpatient Prescriptions  Medication Sig Dispense Refill  . aspirin EC 81 MG tablet Take 1 tablet (81 mg total) by mouth daily.    Marland Kitchen lisinopril (PRINIVIL,ZESTRIL) 10 MG tablet Take 10 mg by mouth daily.      Marland Kitchen lovastatin (MEVACOR) 40 MG tablet Take 80 mg by mouth at bedtime.     Marland Kitchen omeprazole (PRILOSEC) 20 MG capsule Take 20 mg by mouth 2 (two) times daily.      . sildenafil (REVATIO) 20 MG tablet Take 2 tablets (40 mg total) by mouth daily as needed. 180 tablet 0   No current facility-administered medications for this visit.    Past Medical History  Diagnosis Date  . Hypertension   . CAD (coronary artery disease)     Mild plaque 2005  . DJD (degenerative joint disease)   . TIA (transient ischemic attack)   . Carotid artery disease   . Syncope   . HLD (hyperlipidemia)     Past Surgical History  Procedure Laterality Date  . None      History   Social History  . Marital Status: Married    Spouse Name: N/A  . Number of Children: N/A  . Years of Education: N/A   Occupational History   . Disabled    Social History Main Topics  . Smoking status: Former Smoker    Start date: 02/21/2007  . Smokeless tobacco: Never Used  . Alcohol Use: No  . Drug Use: No  . Sexual Activity: Not on file   Other Topics Concern  . Not on file   Social History Narrative   Regular exercise: Yes     Filed Vitals:   07/28/14 1453  BP: 138/72  Pulse: 69  Height: 6\' 1"  (1.854 m)  Weight: 326 lb (147.873 kg)  SpO2: 98%    PHYSICAL EXAM General: NAD HEENT: Normal. Neck: No JVD, no thyromegaly. Lungs: Clear to auscultation bilaterally with normal respiratory effort. CV: Nondisplaced PMI.  Regular rate and rhythm, normal S1/S2, no S3/S4, no murmur. No pretibial or periankle edema.  No carotid bruit.   Abdomen: Obese, nontender, no hepatosplenomegaly, no distention.  Neurologic: Alert and oriented x 3.  Psych: Normal affect. Skin: Normal. Musculoskeletal: Normal range of motion, no gross deformities. Extremities: No clubbing or cyanosis.   ECG: Most recent ECG reviewed.      ASSESSMENT AND PLAN:  CAD, NATIVE VESSEL  Symptomatically stable. Continue risk factor modification. Will continue ASA 81 mg and lovastatin.  Syncope  Noted to have one prior episode with no recurrence. History of normal LV function.   Essential hypertension  Reasonably controlled. No changes to  therapy.  Hyperlipidemia On lovastatin, followed by primary M.D. Recommend target LDL 100 or less, if feasible. Will obtain copy of lipids from PCP.  Carotid artery disease  Mild disease by Doppler study 11/2011. Will obtain copy of most recent study.  Dispo: f/u 1 year.   Kate Sable, M.D., F.A.C.C.

## 2014-07-28 NOTE — Patient Instructions (Signed)
Continue all current medications. Your physician wants you to follow up in:  1 year.  You will receive a reminder letter in the mail one-two months in advance.  If you don't receive a letter, please call our office to schedule the follow up appointment   

## 2014-07-29 ENCOUNTER — Encounter: Payer: Self-pay | Admitting: *Deleted

## 2014-12-15 ENCOUNTER — Telehealth: Payer: Self-pay | Admitting: Cardiovascular Disease

## 2014-12-15 NOTE — Telephone Encounter (Signed)
Contacted patient today by telephone trying to schedule a follow up appointment. No answer 2 year Cartoid doppler fu per [facility] (last one done 12/08/2011)  Will mail patient a reminder letter.

## 2014-12-18 ENCOUNTER — Other Ambulatory Visit: Payer: Self-pay | Admitting: Cardiology

## 2014-12-18 DIAGNOSIS — I6523 Occlusion and stenosis of bilateral carotid arteries: Secondary | ICD-10-CM

## 2015-01-07 ENCOUNTER — Ambulatory Visit (INDEPENDENT_AMBULATORY_CARE_PROVIDER_SITE_OTHER): Payer: Medicare Other

## 2015-01-07 DIAGNOSIS — I6523 Occlusion and stenosis of bilateral carotid arteries: Secondary | ICD-10-CM | POA: Diagnosis not present

## 2015-03-22 DIAGNOSIS — I10 Essential (primary) hypertension: Secondary | ICD-10-CM | POA: Diagnosis not present

## 2015-03-22 DIAGNOSIS — Z7982 Long term (current) use of aspirin: Secondary | ICD-10-CM | POA: Diagnosis not present

## 2015-03-22 DIAGNOSIS — Z8673 Personal history of transient ischemic attack (TIA), and cerebral infarction without residual deficits: Secondary | ICD-10-CM | POA: Diagnosis not present

## 2015-03-22 DIAGNOSIS — K219 Gastro-esophageal reflux disease without esophagitis: Secondary | ICD-10-CM | POA: Diagnosis not present

## 2015-03-22 DIAGNOSIS — E7800 Pure hypercholesterolemia, unspecified: Secondary | ICD-10-CM | POA: Diagnosis not present

## 2015-03-22 DIAGNOSIS — M1990 Unspecified osteoarthritis, unspecified site: Secondary | ICD-10-CM | POA: Diagnosis not present

## 2015-03-22 DIAGNOSIS — R079 Chest pain, unspecified: Secondary | ICD-10-CM | POA: Diagnosis not present

## 2015-03-22 DIAGNOSIS — I2510 Atherosclerotic heart disease of native coronary artery without angina pectoris: Secondary | ICD-10-CM | POA: Diagnosis not present

## 2015-03-22 DIAGNOSIS — R0602 Shortness of breath: Secondary | ICD-10-CM | POA: Diagnosis not present

## 2015-03-23 DIAGNOSIS — I10 Essential (primary) hypertension: Secondary | ICD-10-CM | POA: Diagnosis not present

## 2015-03-23 DIAGNOSIS — K219 Gastro-esophageal reflux disease without esophagitis: Secondary | ICD-10-CM | POA: Diagnosis not present

## 2015-03-23 DIAGNOSIS — R0602 Shortness of breath: Secondary | ICD-10-CM | POA: Diagnosis not present

## 2015-03-23 DIAGNOSIS — I2510 Atherosclerotic heart disease of native coronary artery without angina pectoris: Secondary | ICD-10-CM | POA: Diagnosis not present

## 2015-03-23 DIAGNOSIS — R079 Chest pain, unspecified: Secondary | ICD-10-CM | POA: Diagnosis not present

## 2015-03-24 DIAGNOSIS — I10 Essential (primary) hypertension: Secondary | ICD-10-CM | POA: Diagnosis not present

## 2015-03-24 DIAGNOSIS — R079 Chest pain, unspecified: Secondary | ICD-10-CM | POA: Diagnosis not present

## 2015-03-24 DIAGNOSIS — K760 Fatty (change of) liver, not elsewhere classified: Secondary | ICD-10-CM | POA: Diagnosis not present

## 2015-03-25 DIAGNOSIS — R079 Chest pain, unspecified: Secondary | ICD-10-CM | POA: Diagnosis not present

## 2015-04-02 DIAGNOSIS — Z6841 Body Mass Index (BMI) 40.0 and over, adult: Secondary | ICD-10-CM | POA: Diagnosis not present

## 2015-04-02 DIAGNOSIS — J449 Chronic obstructive pulmonary disease, unspecified: Secondary | ICD-10-CM | POA: Diagnosis not present

## 2015-04-02 DIAGNOSIS — Z418 Encounter for other procedures for purposes other than remedying health state: Secondary | ICD-10-CM | POA: Diagnosis not present

## 2015-04-02 DIAGNOSIS — I10 Essential (primary) hypertension: Secondary | ICD-10-CM | POA: Diagnosis not present

## 2015-04-02 DIAGNOSIS — Z09 Encounter for follow-up examination after completed treatment for conditions other than malignant neoplasm: Secondary | ICD-10-CM | POA: Diagnosis not present

## 2015-04-02 DIAGNOSIS — Z87891 Personal history of nicotine dependence: Secondary | ICD-10-CM | POA: Diagnosis not present

## 2015-04-15 DIAGNOSIS — I10 Essential (primary) hypertension: Secondary | ICD-10-CM | POA: Diagnosis not present

## 2015-04-15 DIAGNOSIS — J209 Acute bronchitis, unspecified: Secondary | ICD-10-CM | POA: Diagnosis not present

## 2015-04-26 DIAGNOSIS — Z1211 Encounter for screening for malignant neoplasm of colon: Secondary | ICD-10-CM | POA: Diagnosis not present

## 2015-04-26 DIAGNOSIS — Z6841 Body Mass Index (BMI) 40.0 and over, adult: Secondary | ICD-10-CM | POA: Diagnosis not present

## 2015-04-26 DIAGNOSIS — Z299 Encounter for prophylactic measures, unspecified: Secondary | ICD-10-CM | POA: Diagnosis not present

## 2015-04-26 DIAGNOSIS — Z7189 Other specified counseling: Secondary | ICD-10-CM | POA: Diagnosis not present

## 2015-04-26 DIAGNOSIS — Z1389 Encounter for screening for other disorder: Secondary | ICD-10-CM | POA: Diagnosis not present

## 2015-04-26 DIAGNOSIS — Z0000 Encounter for general adult medical examination without abnormal findings: Secondary | ICD-10-CM | POA: Diagnosis not present

## 2015-04-27 DIAGNOSIS — I10 Essential (primary) hypertension: Secondary | ICD-10-CM | POA: Diagnosis not present

## 2015-04-27 DIAGNOSIS — I639 Cerebral infarction, unspecified: Secondary | ICD-10-CM | POA: Diagnosis not present

## 2015-04-27 DIAGNOSIS — J449 Chronic obstructive pulmonary disease, unspecified: Secondary | ICD-10-CM | POA: Diagnosis not present

## 2015-04-27 DIAGNOSIS — E7800 Pure hypercholesterolemia, unspecified: Secondary | ICD-10-CM | POA: Diagnosis not present

## 2015-04-29 DIAGNOSIS — E7800 Pure hypercholesterolemia, unspecified: Secondary | ICD-10-CM | POA: Diagnosis not present

## 2015-04-29 DIAGNOSIS — Z79899 Other long term (current) drug therapy: Secondary | ICD-10-CM | POA: Diagnosis not present

## 2015-04-29 DIAGNOSIS — Z125 Encounter for screening for malignant neoplasm of prostate: Secondary | ICD-10-CM | POA: Diagnosis not present

## 2015-04-29 DIAGNOSIS — R5383 Other fatigue: Secondary | ICD-10-CM | POA: Diagnosis not present

## 2015-06-24 DIAGNOSIS — J449 Chronic obstructive pulmonary disease, unspecified: Secondary | ICD-10-CM | POA: Diagnosis not present

## 2015-06-24 DIAGNOSIS — I10 Essential (primary) hypertension: Secondary | ICD-10-CM | POA: Diagnosis not present

## 2015-06-24 DIAGNOSIS — I639 Cerebral infarction, unspecified: Secondary | ICD-10-CM | POA: Diagnosis not present

## 2015-06-24 DIAGNOSIS — E7800 Pure hypercholesterolemia, unspecified: Secondary | ICD-10-CM | POA: Diagnosis not present

## 2015-07-06 DIAGNOSIS — M779 Enthesopathy, unspecified: Secondary | ICD-10-CM | POA: Diagnosis not present

## 2015-07-06 DIAGNOSIS — Z87891 Personal history of nicotine dependence: Secondary | ICD-10-CM | POA: Diagnosis not present

## 2015-07-06 DIAGNOSIS — I10 Essential (primary) hypertension: Secondary | ICD-10-CM | POA: Diagnosis not present

## 2015-07-06 DIAGNOSIS — K219 Gastro-esophageal reflux disease without esophagitis: Secondary | ICD-10-CM | POA: Diagnosis not present

## 2015-07-06 DIAGNOSIS — M778 Other enthesopathies, not elsewhere classified: Secondary | ICD-10-CM | POA: Diagnosis not present

## 2015-07-06 DIAGNOSIS — E7800 Pure hypercholesterolemia, unspecified: Secondary | ICD-10-CM | POA: Diagnosis not present

## 2015-07-06 DIAGNOSIS — Z79899 Other long term (current) drug therapy: Secondary | ICD-10-CM | POA: Diagnosis not present

## 2015-07-09 DIAGNOSIS — G5601 Carpal tunnel syndrome, right upper limb: Secondary | ICD-10-CM | POA: Diagnosis not present

## 2015-07-09 DIAGNOSIS — L0390 Cellulitis, unspecified: Secondary | ICD-10-CM | POA: Diagnosis not present

## 2015-07-14 DIAGNOSIS — I639 Cerebral infarction, unspecified: Secondary | ICD-10-CM | POA: Diagnosis not present

## 2015-07-14 DIAGNOSIS — L989 Disorder of the skin and subcutaneous tissue, unspecified: Secondary | ICD-10-CM | POA: Diagnosis not present

## 2015-07-14 DIAGNOSIS — I10 Essential (primary) hypertension: Secondary | ICD-10-CM | POA: Diagnosis not present

## 2015-07-16 DIAGNOSIS — S50811A Abrasion of right forearm, initial encounter: Secondary | ICD-10-CM | POA: Diagnosis not present

## 2015-07-16 DIAGNOSIS — H6690 Otitis media, unspecified, unspecified ear: Secondary | ICD-10-CM | POA: Diagnosis not present

## 2015-07-16 DIAGNOSIS — L980 Pyogenic granuloma: Secondary | ICD-10-CM | POA: Diagnosis not present

## 2015-07-23 DIAGNOSIS — L980 Pyogenic granuloma: Secondary | ICD-10-CM | POA: Diagnosis not present

## 2015-07-26 DIAGNOSIS — Z299 Encounter for prophylactic measures, unspecified: Secondary | ICD-10-CM | POA: Diagnosis not present

## 2015-07-26 DIAGNOSIS — I10 Essential (primary) hypertension: Secondary | ICD-10-CM | POA: Diagnosis not present

## 2015-07-26 DIAGNOSIS — J441 Chronic obstructive pulmonary disease with (acute) exacerbation: Secondary | ICD-10-CM | POA: Diagnosis not present

## 2015-07-27 ENCOUNTER — Ambulatory Visit (INDEPENDENT_AMBULATORY_CARE_PROVIDER_SITE_OTHER): Payer: Medicare Other | Admitting: Cardiovascular Disease

## 2015-07-27 ENCOUNTER — Encounter: Payer: Self-pay | Admitting: Cardiovascular Disease

## 2015-07-27 VITALS — BP 109/74 | HR 57 | Ht 73.0 in | Wt 311.0 lb

## 2015-07-27 DIAGNOSIS — E785 Hyperlipidemia, unspecified: Secondary | ICD-10-CM

## 2015-07-27 DIAGNOSIS — I25118 Atherosclerotic heart disease of native coronary artery with other forms of angina pectoris: Secondary | ICD-10-CM

## 2015-07-27 DIAGNOSIS — I779 Disorder of arteries and arterioles, unspecified: Secondary | ICD-10-CM | POA: Diagnosis not present

## 2015-07-27 DIAGNOSIS — Z87898 Personal history of other specified conditions: Secondary | ICD-10-CM

## 2015-07-27 DIAGNOSIS — I10 Essential (primary) hypertension: Secondary | ICD-10-CM | POA: Diagnosis not present

## 2015-07-27 DIAGNOSIS — Z9289 Personal history of other medical treatment: Secondary | ICD-10-CM

## 2015-07-27 DIAGNOSIS — I739 Peripheral vascular disease, unspecified: Secondary | ICD-10-CM

## 2015-07-27 NOTE — Progress Notes (Signed)
Patient ID: Logan Hunt, male   DOB: 14-Mar-1955, 60 y.o.   MRN: [id number]      SUBJECTIVE: The patient presents for routine follow-up. He has a history of mild nonobstructive coronary artery disease as per cardiac catheterization performed in 2005. He underwent a low risk nuclear stress test in 2013. He also has a history of TIA and carotid artery disease.  He was hospitalized in late January/early February for chest pain which radiated into his left shoulder and down his left arm. He ruled out for an acute coronary syndrome. I personally interpreted and reviewed all labs, studies, and documentation pertaining to this hospitalization.   Troponin, BNP, and d-dimer were normal. Chest x-ray was suggestive of atelectasis versus pneumonia. He ultimately underwent an exercise treadmill stress test on 03/25/15 at [facility]. He exercised for 4 minutes and 57 seconds achieving stage II of the Bruce protocol and completed 7 minutes. He was hypertensive throughout the study with a peak blood pressure 224/73.  ECG demonstrated normal sinus rhythm on 03/22/15.  Abdominal ultrasound demonstrated fatty liver.  He said "this was a wake-up call for me". He has been strictly dieting and has lost 32 pounds. He eats vegetables and only items which are baked or boiled and avoids fried foods altogether. He feels much better and says his shortness of breath has improved tremendously.  He denies any symptoms of palpitations, shortness of breath, lightheadedness, dizziness, leg swelling, orthopnea, PND, and syncope.   Review of Systems: As per "subjective", otherwise negative.  No Known Allergies  Current Outpatient Prescriptions  Medication Sig Dispense Refill  . aspirin EC 81 MG tablet Take 1 tablet (81 mg total) by mouth daily.    Marland Kitchen doxycycline (VIBRA-TABS) 100 MG tablet Take 1 tablet by mouth 2 (two) times daily.    Marland Kitchen lovastatin (MEVACOR) 40 MG tablet Take 80 mg by mouth at bedtime.     .  metoprolol tartrate (LOPRESSOR) 25 MG tablet Take 25 mg by mouth 2 (two) times daily.    Marland Kitchen omeprazole (PRILOSEC) 40 MG capsule Take 1 capsule by mouth daily.    . tamsulosin (FLOMAX) 0.4 MG CAPS capsule Take 1 capsule by mouth at bedtime.     No current facility-administered medications for this visit.    Past Medical History  Diagnosis Date  . Hypertension   . CAD (coronary artery disease)     Mild plaque 2005  . DJD (degenerative joint disease)   . TIA (transient ischemic attack)   . Carotid artery disease ([place])   . Syncope   . HLD (hyperlipidemia)     Past Surgical History  Procedure Laterality Date  . None    . Cardiac catheterization      Social History   Social History  . Marital Status: Married    Spouse Name: N/A  . Number of Children: N/A  . Years of Education: N/A   Occupational History  . Disabled    Social History Main Topics  . Smoking status: Former Smoker -- 2.00 packs/day for 37 years    Types: Cigarettes    Start date: 04/02/1970    Quit date: 02/21/2007  . Smokeless tobacco: Never Used  . Alcohol Use: No  . Drug Use: No  . Sexual Activity: Not on file   Other Topics Concern  . Not on file   Social History Narrative   Regular exercise: Yes     Filed Vitals:   07/27/15 0807  BP: 109/74  Pulse: 57  Height: 6\' 1"  (1.854 m)  Weight: 311 lb (141.069 kg)    PHYSICAL EXAM General: NAD HEENT: Normal. Neck: No JVD, no thyromegaly. Lungs: Clear to auscultation bilaterally with normal respiratory effort. CV: Nondisplaced PMI.  Regular rate and rhythm, normal S1/S2, no S3/S4, no murmur. No pretibial or periankle edema.  No carotid bruit.   Abdomen: Obese. Neurologic: Alert and oriented.  Psych: Normal affect. Skin: Normal. Musculoskeletal: No gross deformities.    ECG: Most recent ECG reviewed.      ASSESSMENT AND PLAN:  CAD, NATIVE VESSEL  Symptomatically stable. No ischemia by stress testing in 03/2015. Continue risk factor  modification with diet and exercise. Will continue ASA 81 mg, metoprolol, and lovastatin.  Syncope  Noted to have one prior episode with no recurrence. History of normal LV function.   Essential hypertension  Well controlled. Markedly hypertensive during stress testing. No changes to therapy.  Hyperlipidemia On lovastatin, followed by primary M.D. Recommend target LDL 100 or less, if feasible. Will obtain copy of lipids from PCP.  Carotid artery disease  Mild disease by Doppler study 12/2014 (1-39% b/l). Repeat in 3 years.  Dispo: f/u 1 year.  Time spent: 40 minutes, of which greater than 50% was spent reviewing symptoms, relevant blood tests and studies, and discussing management plan with the patient.   Kate Sable, M.D., F.A.C.C.

## 2015-07-27 NOTE — Patient Instructions (Signed)
Your physician wants you to follow-up in: 1 year with Dr. Koneswaran.  You will receive a reminder letter in the mail two months in advance. If you don't receive a letter, please call our office to schedule the follow-up appointment.  Your physician recommends that you continue on your current medications as directed. Please refer to the Current Medication list given to you today.  Thank you for choosing [facility]!   

## 2015-07-28 DIAGNOSIS — E7800 Pure hypercholesterolemia, unspecified: Secondary | ICD-10-CM | POA: Diagnosis not present

## 2015-07-28 DIAGNOSIS — J449 Chronic obstructive pulmonary disease, unspecified: Secondary | ICD-10-CM | POA: Diagnosis not present

## 2015-07-28 DIAGNOSIS — I639 Cerebral infarction, unspecified: Secondary | ICD-10-CM | POA: Diagnosis not present

## 2015-07-28 DIAGNOSIS — I10 Essential (primary) hypertension: Secondary | ICD-10-CM | POA: Diagnosis not present

## 2015-08-02 ENCOUNTER — Ambulatory Visit: Payer: Medicare Other | Admitting: Cardiovascular Disease

## 2015-09-10 DIAGNOSIS — I639 Cerebral infarction, unspecified: Secondary | ICD-10-CM | POA: Diagnosis not present

## 2015-09-10 DIAGNOSIS — E7800 Pure hypercholesterolemia, unspecified: Secondary | ICD-10-CM | POA: Diagnosis not present

## 2015-09-10 DIAGNOSIS — J449 Chronic obstructive pulmonary disease, unspecified: Secondary | ICD-10-CM | POA: Diagnosis not present

## 2015-09-10 DIAGNOSIS — I10 Essential (primary) hypertension: Secondary | ICD-10-CM | POA: Diagnosis not present

## 2015-10-16 DIAGNOSIS — R200 Anesthesia of skin: Secondary | ICD-10-CM | POA: Diagnosis not present

## 2015-10-16 DIAGNOSIS — I10 Essential (primary) hypertension: Secondary | ICD-10-CM | POA: Diagnosis not present

## 2015-10-16 DIAGNOSIS — Z79899 Other long term (current) drug therapy: Secondary | ICD-10-CM | POA: Diagnosis not present

## 2015-10-16 DIAGNOSIS — Z7982 Long term (current) use of aspirin: Secondary | ICD-10-CM | POA: Diagnosis not present

## 2015-10-16 DIAGNOSIS — K219 Gastro-esophageal reflux disease without esophagitis: Secondary | ICD-10-CM | POA: Diagnosis not present

## 2015-10-16 DIAGNOSIS — R42 Dizziness and giddiness: Secondary | ICD-10-CM | POA: Diagnosis not present

## 2015-10-28 DIAGNOSIS — R42 Dizziness and giddiness: Secondary | ICD-10-CM | POA: Diagnosis not present

## 2015-10-28 DIAGNOSIS — I10 Essential (primary) hypertension: Secondary | ICD-10-CM | POA: Diagnosis not present

## 2015-10-28 DIAGNOSIS — J449 Chronic obstructive pulmonary disease, unspecified: Secondary | ICD-10-CM | POA: Diagnosis not present

## 2015-10-28 DIAGNOSIS — I639 Cerebral infarction, unspecified: Secondary | ICD-10-CM | POA: Diagnosis not present

## 2015-11-08 DIAGNOSIS — Z713 Dietary counseling and surveillance: Secondary | ICD-10-CM | POA: Diagnosis not present

## 2015-11-08 DIAGNOSIS — J449 Chronic obstructive pulmonary disease, unspecified: Secondary | ICD-10-CM | POA: Diagnosis not present

## 2015-11-08 DIAGNOSIS — Z6841 Body Mass Index (BMI) 40.0 and over, adult: Secondary | ICD-10-CM | POA: Diagnosis not present

## 2015-12-01 DIAGNOSIS — I10 Essential (primary) hypertension: Secondary | ICD-10-CM | POA: Diagnosis not present

## 2015-12-01 DIAGNOSIS — I639 Cerebral infarction, unspecified: Secondary | ICD-10-CM | POA: Diagnosis not present

## 2015-12-01 DIAGNOSIS — J449 Chronic obstructive pulmonary disease, unspecified: Secondary | ICD-10-CM | POA: Diagnosis not present

## 2015-12-01 DIAGNOSIS — E7800 Pure hypercholesterolemia, unspecified: Secondary | ICD-10-CM | POA: Diagnosis not present

## 2015-12-03 DIAGNOSIS — N182 Chronic kidney disease, stage 2 (mild): Secondary | ICD-10-CM | POA: Diagnosis not present

## 2015-12-03 DIAGNOSIS — E1122 Type 2 diabetes mellitus with diabetic chronic kidney disease: Secondary | ICD-10-CM | POA: Diagnosis not present

## 2015-12-03 DIAGNOSIS — E7800 Pure hypercholesterolemia, unspecified: Secondary | ICD-10-CM | POA: Diagnosis not present

## 2015-12-03 DIAGNOSIS — Z299 Encounter for prophylactic measures, unspecified: Secondary | ICD-10-CM | POA: Diagnosis not present

## 2015-12-03 DIAGNOSIS — K529 Noninfective gastroenteritis and colitis, unspecified: Secondary | ICD-10-CM | POA: Diagnosis not present

## 2015-12-03 DIAGNOSIS — Z6841 Body Mass Index (BMI) 40.0 and over, adult: Secondary | ICD-10-CM | POA: Diagnosis not present

## 2015-12-08 DIAGNOSIS — Z23 Encounter for immunization: Secondary | ICD-10-CM | POA: Diagnosis not present

## 2015-12-13 DIAGNOSIS — H2513 Age-related nuclear cataract, bilateral: Secondary | ICD-10-CM | POA: Diagnosis not present

## 2015-12-13 DIAGNOSIS — E119 Type 2 diabetes mellitus without complications: Secondary | ICD-10-CM | POA: Diagnosis not present

## 2015-12-15 ENCOUNTER — Encounter: Payer: Medicare Other | Attending: Internal Medicine | Admitting: Nutrition

## 2015-12-15 VITALS — Ht 72.0 in | Wt 333.0 lb

## 2015-12-15 DIAGNOSIS — E7800 Pure hypercholesterolemia, unspecified: Secondary | ICD-10-CM

## 2015-12-15 DIAGNOSIS — N189 Chronic kidney disease, unspecified: Secondary | ICD-10-CM | POA: Insufficient documentation

## 2015-12-15 DIAGNOSIS — I10 Essential (primary) hypertension: Secondary | ICD-10-CM

## 2015-12-15 DIAGNOSIS — R739 Hyperglycemia, unspecified: Secondary | ICD-10-CM

## 2015-12-15 DIAGNOSIS — E1122 Type 2 diabetes mellitus with diabetic chronic kidney disease: Secondary | ICD-10-CM | POA: Diagnosis not present

## 2015-12-15 DIAGNOSIS — Z713 Dietary counseling and surveillance: Secondary | ICD-10-CM | POA: Diagnosis not present

## 2015-12-15 NOTE — Progress Notes (Signed)
  Medical Nutrition Therapy:  Appt start time: 0800 end time:  0900.  Assessment:  Primary concerns today: Diabetes Type 2  Vs Prediabetes.  A1C 6.2%.Here with his wife.  HIs wife does the shopping and cooking. Eats three meals per day and snacks during day. PMH: CKD, DM and HTN, Hyperlipidemia. Newly diagnosed Type 2 DM/Prediabetes. Gained 40 lbs in last year. He admits to eating a lot of food later in evening, snacking, no exercise and eating a high fat high sodium high sugar diet. Committed 100% to change his diet and improve his health and lose weight.   Diet is excessive in calories, fat, sodium and low in fresh fruits and low carb vegetables.  Testing blood sugars in am  130-140's mg/dl. No medication at present.  Preferred Learning Style:   No preference indicated   Learning Readiness:  Ready  Change in progress   MEDICATIONS: see list   DIETARY INTAKE:.    24-hr recall:  B ( AM): Boiled eggs 2, grapes 1 cup, water  Snk ( AM): none L ( PM): Mixed beans  2 cups, water Snk ( PM): misc popcorn D ( PM): Chicken stirfry and green beans, water  Snk ( PM) a pickle loaf: sandwich on white bread, 2 slices of bologna and fruit and water, 1 TBSP peanutbutter Beverages: water, Soda. Usual physical activity: ADL  Estimated energy needs: 1800 calories 200 g carbohydrates  135 g protein 50 g fat  Progress Towards Goal(s):  In progress.   Nutritional Diagnosis:  NB-1.1 Food and nutrition-related knowledge deficit As related to Prediabetes, CKD, HTN and hyperlipidemia.  As evidenced by A1C 6.2%, Urine Creatine 50 and  low HDL 27. .    Intervention:  Nutrition and Diabetes education provided on My Plate, CHO counting, meal planning, portion sizes, timing of meals, avoiding snacks between meals unless having a low blood sugar, target ranges for A1C and blood sugars, signs/symptoms and treatment of hyper/hypoglycemia, monitoring blood sugars, taking medications as prescribed, benefits of  exercising 30 minutes per day and prevention of complications of DM. Low Sodium Low Fat High Fiber diet.  Goals 1. Follow My Plate 2. Increase fresh fruits and vegetables. 3. Cut out snacks between meals and after dinner 4. Drink a gallon of water per day 5. Cut out processed foods 6. Lose 1-2 lbs per week--10 lbs by next visit. Get A1C less than 6%.  Teaching Method Utilized:  Visual Auditory Hands on  Handouts given during visit include:  The Plate Method   Meal Plan Card  Diabetes Instructions.   Barriers to learning/adherence to lifestyle change: none  Demonstrated degree of understanding via:  Teach Back   Monitoring/Evaluation:  Dietary intake, exercise, meal planning, SBG, and body weight in 1 month(s).

## 2015-12-15 NOTE — Patient Instructions (Signed)
Goals 1. Follow My Plate 2. Increase fresh fruits and vegetables. 3. Cut out snacks between meals and after dinner 4. Drink a gallon of water per day 5. Cut out processed foods 6. Lose 1-2 lbs per week--10 lbs by next visit. Get A1C less than 6%.

## 2015-12-17 DIAGNOSIS — Z299 Encounter for prophylactic measures, unspecified: Secondary | ICD-10-CM | POA: Diagnosis not present

## 2015-12-17 DIAGNOSIS — Z6841 Body Mass Index (BMI) 40.0 and over, adult: Secondary | ICD-10-CM | POA: Diagnosis not present

## 2015-12-17 DIAGNOSIS — N182 Chronic kidney disease, stage 2 (mild): Secondary | ICD-10-CM | POA: Diagnosis not present

## 2015-12-17 DIAGNOSIS — E1122 Type 2 diabetes mellitus with diabetic chronic kidney disease: Secondary | ICD-10-CM | POA: Diagnosis not present

## 2015-12-17 DIAGNOSIS — H9201 Otalgia, right ear: Secondary | ICD-10-CM | POA: Diagnosis not present

## 2015-12-17 DIAGNOSIS — G4730 Sleep apnea, unspecified: Secondary | ICD-10-CM | POA: Diagnosis not present

## 2016-01-13 DIAGNOSIS — Z7982 Long term (current) use of aspirin: Secondary | ICD-10-CM | POA: Diagnosis not present

## 2016-01-13 DIAGNOSIS — K219 Gastro-esophageal reflux disease without esophagitis: Secondary | ICD-10-CM | POA: Diagnosis not present

## 2016-01-13 DIAGNOSIS — Z79899 Other long term (current) drug therapy: Secondary | ICD-10-CM | POA: Diagnosis not present

## 2016-01-13 DIAGNOSIS — E7800 Pure hypercholesterolemia, unspecified: Secondary | ICD-10-CM | POA: Diagnosis not present

## 2016-01-13 DIAGNOSIS — J029 Acute pharyngitis, unspecified: Secondary | ICD-10-CM | POA: Diagnosis not present

## 2016-01-13 DIAGNOSIS — Z87891 Personal history of nicotine dependence: Secondary | ICD-10-CM | POA: Diagnosis not present

## 2016-01-13 DIAGNOSIS — I10 Essential (primary) hypertension: Secondary | ICD-10-CM | POA: Diagnosis not present

## 2016-01-17 ENCOUNTER — Encounter: Payer: Medicare Other | Attending: Internal Medicine | Admitting: Nutrition

## 2016-01-17 ENCOUNTER — Encounter: Payer: Self-pay | Admitting: Nutrition

## 2016-01-17 VITALS — Ht 72.0 in | Wt 313.0 lb

## 2016-01-17 DIAGNOSIS — E1122 Type 2 diabetes mellitus with diabetic chronic kidney disease: Secondary | ICD-10-CM | POA: Diagnosis not present

## 2016-01-17 DIAGNOSIS — N189 Chronic kidney disease, unspecified: Secondary | ICD-10-CM | POA: Insufficient documentation

## 2016-01-17 DIAGNOSIS — Z6841 Body Mass Index (BMI) 40.0 and over, adult: Secondary | ICD-10-CM | POA: Diagnosis not present

## 2016-01-17 DIAGNOSIS — Z713 Dietary counseling and surveillance: Secondary | ICD-10-CM | POA: Diagnosis not present

## 2016-01-17 DIAGNOSIS — E119 Type 2 diabetes mellitus without complications: Secondary | ICD-10-CM

## 2016-01-17 NOTE — Progress Notes (Signed)
  Medical Nutrition Therapy:  Appt start time: 0800 end time:  0900.  Assessment:  Primary concerns today: Diabetes Type 2   A1C 6.2%.Here with his wife. Has lost 20 lbs in the last 2-3 months. BMI 42 He has been eating a lot more low carb vegetables and cutting out snacks and drinking only water. Has been exercising and walking a lot. Feels much better. BS are 90-100's. Will get A1C done in March.  Doing much better! Following diet very well. Expect further progress with weight loss. Not on any meds for DM.  Preferred Learning Style:   No preference indicated   Learning Readiness:  Ready  Change in progress   MEDICATIONS: see list   DIETARY INTAKE:.    24-hr recall:  B ( AM): Boiled eggs 2, 3/4 c bran cereal and milk Snk ( AM): none L ( PM): Salad, red potato, 2 chicken strips, water Snk ( PM):  D ( PM): Same as lunch. Snk ( PM) a pickle loaf: sandwich on white bread, 2 slices of bologna and fruit and water, 1 TBSP peanutbutter Beverages: water, Soda. Usual physical activity: ADL  Estimated energy needs: 1800 calories 200 g carbohydrates  135 g protein 50 g fat  Progress Towards Goal(s):  In progress.   Nutritional Diagnosis:  NB-1.1 Food and nutrition-related knowledge deficit As related to Prediabetes, CKD, HTN and hyperlipidemia.  As evidenced by A1C 6.2%, Urine Creatine 50 and  low HDL 27. .    Intervention:  Nutrition and Diabetes education provided on My Plate, CHO counting, meal planning, portion sizes, timing of meals, avoiding snacks between meals unless having a low blood sugar, target ranges for A1C and blood sugars, signs/symptoms and treatment of hyper/hypoglycemia, monitoring blood sugars, taking medications as prescribed, benefits of exercising 30 minutes per day and prevention of complications of DM. Low Sodium Low Fat High Fiber diet.  Keep up the good job! Keep exercising Eat 3 pieces of fruits per day Eat 4 svg of low carb vegetables. Drink only  water  Lose 5 lbs per month   Keep A1C less 6% Teaching Method Utilized:  Visual Auditory Hands on  Handouts given during visit include:  The Plate Method   Meal Plan Card  Diabetes Instructions.   Barriers to learning/adherence to lifestyle change: none  Demonstrated degree of understanding via:  Teach Back   Monitoring/Evaluation:  Dietary intake, exercise, meal planning, SBG, and body weight in 3-4 month(s).

## 2016-01-17 NOTE — Patient Instructions (Signed)
Keep up the good job! Keep exercising Eat 3 pieces of fruits per day Eat 4 svg of low carb vegetables. Drink only water  Lose 5 lbs per month Keep A1C less 6%

## 2016-01-25 DIAGNOSIS — I10 Essential (primary) hypertension: Secondary | ICD-10-CM | POA: Diagnosis not present

## 2016-01-25 DIAGNOSIS — I639 Cerebral infarction, unspecified: Secondary | ICD-10-CM | POA: Diagnosis not present

## 2016-01-25 DIAGNOSIS — J449 Chronic obstructive pulmonary disease, unspecified: Secondary | ICD-10-CM | POA: Diagnosis not present

## 2016-01-25 DIAGNOSIS — E7800 Pure hypercholesterolemia, unspecified: Secondary | ICD-10-CM | POA: Diagnosis not present

## 2016-02-11 DIAGNOSIS — M549 Dorsalgia, unspecified: Secondary | ICD-10-CM | POA: Diagnosis not present

## 2016-02-11 DIAGNOSIS — M5136 Other intervertebral disc degeneration, lumbar region: Secondary | ICD-10-CM | POA: Diagnosis not present

## 2016-02-11 DIAGNOSIS — E1122 Type 2 diabetes mellitus with diabetic chronic kidney disease: Secondary | ICD-10-CM | POA: Diagnosis not present

## 2016-02-11 DIAGNOSIS — Z299 Encounter for prophylactic measures, unspecified: Secondary | ICD-10-CM | POA: Diagnosis not present

## 2016-02-11 DIAGNOSIS — S39012A Strain of muscle, fascia and tendon of lower back, initial encounter: Secondary | ICD-10-CM | POA: Diagnosis not present

## 2016-02-11 DIAGNOSIS — J449 Chronic obstructive pulmonary disease, unspecified: Secondary | ICD-10-CM | POA: Diagnosis not present

## 2016-02-11 DIAGNOSIS — N182 Chronic kidney disease, stage 2 (mild): Secondary | ICD-10-CM | POA: Diagnosis not present

## 2016-02-11 DIAGNOSIS — M4856XD Collapsed vertebra, not elsewhere classified, lumbar region, subsequent encounter for fracture with routine healing: Secondary | ICD-10-CM | POA: Diagnosis not present

## 2016-02-11 DIAGNOSIS — Z6841 Body Mass Index (BMI) 40.0 and over, adult: Secondary | ICD-10-CM | POA: Diagnosis not present

## 2016-02-29 DIAGNOSIS — E7800 Pure hypercholesterolemia, unspecified: Secondary | ICD-10-CM | POA: Diagnosis not present

## 2016-02-29 DIAGNOSIS — J449 Chronic obstructive pulmonary disease, unspecified: Secondary | ICD-10-CM | POA: Diagnosis not present

## 2016-02-29 DIAGNOSIS — I639 Cerebral infarction, unspecified: Secondary | ICD-10-CM | POA: Diagnosis not present

## 2016-02-29 DIAGNOSIS — I10 Essential (primary) hypertension: Secondary | ICD-10-CM | POA: Diagnosis not present

## 2016-03-31 DIAGNOSIS — Z6841 Body Mass Index (BMI) 40.0 and over, adult: Secondary | ICD-10-CM | POA: Diagnosis not present

## 2016-03-31 DIAGNOSIS — Z87891 Personal history of nicotine dependence: Secondary | ICD-10-CM | POA: Diagnosis not present

## 2016-03-31 DIAGNOSIS — E1122 Type 2 diabetes mellitus with diabetic chronic kidney disease: Secondary | ICD-10-CM | POA: Diagnosis not present

## 2016-03-31 DIAGNOSIS — I639 Cerebral infarction, unspecified: Secondary | ICD-10-CM | POA: Diagnosis not present

## 2016-03-31 DIAGNOSIS — Z713 Dietary counseling and surveillance: Secondary | ICD-10-CM | POA: Diagnosis not present

## 2016-03-31 DIAGNOSIS — N182 Chronic kidney disease, stage 2 (mild): Secondary | ICD-10-CM | POA: Diagnosis not present

## 2016-03-31 DIAGNOSIS — Z299 Encounter for prophylactic measures, unspecified: Secondary | ICD-10-CM | POA: Diagnosis not present

## 2016-03-31 DIAGNOSIS — I10 Essential (primary) hypertension: Secondary | ICD-10-CM | POA: Diagnosis not present

## 2016-03-31 DIAGNOSIS — J0101 Acute recurrent maxillary sinusitis: Secondary | ICD-10-CM | POA: Diagnosis not present

## 2016-03-31 DIAGNOSIS — J449 Chronic obstructive pulmonary disease, unspecified: Secondary | ICD-10-CM | POA: Diagnosis not present

## 2016-04-18 DIAGNOSIS — E7800 Pure hypercholesterolemia, unspecified: Secondary | ICD-10-CM | POA: Diagnosis not present

## 2016-04-18 DIAGNOSIS — I10 Essential (primary) hypertension: Secondary | ICD-10-CM | POA: Diagnosis not present

## 2016-04-18 DIAGNOSIS — J449 Chronic obstructive pulmonary disease, unspecified: Secondary | ICD-10-CM | POA: Diagnosis not present

## 2016-04-18 DIAGNOSIS — I639 Cerebral infarction, unspecified: Secondary | ICD-10-CM | POA: Diagnosis not present

## 2016-05-10 DIAGNOSIS — J449 Chronic obstructive pulmonary disease, unspecified: Secondary | ICD-10-CM | POA: Diagnosis not present

## 2016-05-10 DIAGNOSIS — Z7189 Other specified counseling: Secondary | ICD-10-CM | POA: Diagnosis not present

## 2016-05-10 DIAGNOSIS — Z6841 Body Mass Index (BMI) 40.0 and over, adult: Secondary | ICD-10-CM | POA: Diagnosis not present

## 2016-05-10 DIAGNOSIS — E7800 Pure hypercholesterolemia, unspecified: Secondary | ICD-10-CM | POA: Diagnosis not present

## 2016-05-10 DIAGNOSIS — Z0000 Encounter for general adult medical examination without abnormal findings: Secondary | ICD-10-CM | POA: Diagnosis not present

## 2016-05-10 DIAGNOSIS — I10 Essential (primary) hypertension: Secondary | ICD-10-CM | POA: Diagnosis not present

## 2016-05-10 DIAGNOSIS — N182 Chronic kidney disease, stage 2 (mild): Secondary | ICD-10-CM | POA: Diagnosis not present

## 2016-05-10 DIAGNOSIS — Z125 Encounter for screening for malignant neoplasm of prostate: Secondary | ICD-10-CM | POA: Diagnosis not present

## 2016-05-10 DIAGNOSIS — I639 Cerebral infarction, unspecified: Secondary | ICD-10-CM | POA: Diagnosis not present

## 2016-05-10 DIAGNOSIS — Z299 Encounter for prophylactic measures, unspecified: Secondary | ICD-10-CM | POA: Diagnosis not present

## 2016-05-10 DIAGNOSIS — Z1389 Encounter for screening for other disorder: Secondary | ICD-10-CM | POA: Diagnosis not present

## 2016-05-10 DIAGNOSIS — R5383 Other fatigue: Secondary | ICD-10-CM | POA: Diagnosis not present

## 2016-05-10 DIAGNOSIS — Z1211 Encounter for screening for malignant neoplasm of colon: Secondary | ICD-10-CM | POA: Diagnosis not present

## 2016-05-10 DIAGNOSIS — Z79899 Other long term (current) drug therapy: Secondary | ICD-10-CM | POA: Diagnosis not present

## 2016-05-22 ENCOUNTER — Encounter: Payer: Medicare Other | Attending: Internal Medicine | Admitting: Nutrition

## 2016-05-22 DIAGNOSIS — E669 Obesity, unspecified: Secondary | ICD-10-CM | POA: Insufficient documentation

## 2016-05-22 DIAGNOSIS — Z713 Dietary counseling and surveillance: Secondary | ICD-10-CM | POA: Insufficient documentation

## 2016-05-22 DIAGNOSIS — E119 Type 2 diabetes mellitus without complications: Secondary | ICD-10-CM | POA: Diagnosis not present

## 2016-05-22 NOTE — Patient Instructions (Signed)
Goals 1. Exercise 30 minutes daily 2. Increase fresh fruits and low carb vegetables. 3. Cut out toast at breakfast 4. Cut out mayonnaise based foods 5. Lose 2-3 lbs per month

## 2016-05-22 NOTE — Progress Notes (Signed)
  Medical Nutrition Therapy:  Appt start time: 1330  end time: 1400 Assessment:  Primary concerns today: Diabetes Type 2 and obesity. Saw DR. Manuella Ghazi in March 2018. A1C down to 5.9%. Gained 3 lbs but says he hasn't been able to exercise due to cold and rain. Willing to get back to walking and working in garden. BS are doing very well 89-126 mg/dl. No meds for DM. He is on Mevacor for his Lipids. Lipid profile good with only low HDL of 39 mg/dl. TG 83. LDL 95, could be lower to less than 70 mg/dl for reduced CVD.  He has cut out soda and only drinking water.    He is doing much better and more aware of what he should be eating and not eating.   He would benefit from losing about 20+ lbs over the next year.   Preferred Learning Style:   No preference indicated   Learning Readiness:  Ready  Change in progress   MEDICATIONS: see list   DIETARY INTAKE:.    24-hr recall:  B ( AM):  Eggs, toast 1 slice,  Whole bran cereal,  And 1 cup L) Potato salad, ham, green beans casserole (Easter) OR raw vegetables, [place] sandwich on 2 slices, water and apple Sometimes popcorn Dinner: Ham, green beans casserole, potato salad,   water Usual physical activity: ADL  Estimated energy needs: 1800 calories 200 g carbohydrates  135 g protein 50 g fat  Progress Towards Goal(s):  In progress.   Nutritional Diagnosis:  NB-1.1 Food and nutrition-related knowledge deficit As related to Prediabetes, CKD, HTN and hyperlipidemia.  As evidenced by A1C 6.2%, Urine Creatine 50 and  low HDL 27. .    Intervention:  Nutrition and Diabetes education provided on My Plate, CHO counting, meal planning, portion sizes, timing of meals, avoiding snacks between meals unless having a low blood sugar, target ranges for A1C and blood sugars, signs/symptoms and treatment of hyper/hypoglycemia, monitoring blood sugars, taking medications as prescribed, benefits of exercising 30 minutes per day and prevention of complications of DM.  Low Sodium Low Fat High Fiber diet.  Goals 1. Exercise 30 minutes daily 2. Increase fresh fruits and low carb vegetables. 3. Cut out toast at breakfast 4. Cut out mayonnaise based foods 5. Lose 2-3 lbs per month  Keep A1C less 6% Teaching Method Utilized:  Visual Auditory Hands on  Handouts given during visit include:  The Plate Method   Meal Plan Card  Diabetes Instructions.   Barriers to learning/adherence to lifestyle change: none  Demonstrated degree of understanding via:  Teach Back   Monitoring/Evaluation:  Dietary intake, exercise, meal planning, SBG, and body weight in 3-4 month(s).

## 2016-05-30 DIAGNOSIS — I639 Cerebral infarction, unspecified: Secondary | ICD-10-CM | POA: Diagnosis not present

## 2016-05-30 DIAGNOSIS — I10 Essential (primary) hypertension: Secondary | ICD-10-CM | POA: Diagnosis not present

## 2016-05-30 DIAGNOSIS — E7800 Pure hypercholesterolemia, unspecified: Secondary | ICD-10-CM | POA: Diagnosis not present

## 2016-05-30 DIAGNOSIS — J449 Chronic obstructive pulmonary disease, unspecified: Secondary | ICD-10-CM | POA: Diagnosis not present

## 2016-06-21 DIAGNOSIS — E7800 Pure hypercholesterolemia, unspecified: Secondary | ICD-10-CM | POA: Diagnosis not present

## 2016-06-21 DIAGNOSIS — J449 Chronic obstructive pulmonary disease, unspecified: Secondary | ICD-10-CM | POA: Diagnosis not present

## 2016-06-21 DIAGNOSIS — I10 Essential (primary) hypertension: Secondary | ICD-10-CM | POA: Diagnosis not present

## 2016-06-21 DIAGNOSIS — I639 Cerebral infarction, unspecified: Secondary | ICD-10-CM | POA: Diagnosis not present

## 2016-07-05 DIAGNOSIS — M25511 Pain in right shoulder: Secondary | ICD-10-CM | POA: Diagnosis not present

## 2016-07-05 DIAGNOSIS — J449 Chronic obstructive pulmonary disease, unspecified: Secondary | ICD-10-CM | POA: Diagnosis not present

## 2016-07-05 DIAGNOSIS — E1165 Type 2 diabetes mellitus with hyperglycemia: Secondary | ICD-10-CM | POA: Diagnosis not present

## 2016-07-05 DIAGNOSIS — E7800 Pure hypercholesterolemia, unspecified: Secondary | ICD-10-CM | POA: Diagnosis not present

## 2016-07-05 DIAGNOSIS — I639 Cerebral infarction, unspecified: Secondary | ICD-10-CM | POA: Diagnosis not present

## 2016-07-05 DIAGNOSIS — I10 Essential (primary) hypertension: Secondary | ICD-10-CM | POA: Diagnosis not present

## 2016-07-05 DIAGNOSIS — Z713 Dietary counseling and surveillance: Secondary | ICD-10-CM | POA: Diagnosis not present

## 2016-07-05 DIAGNOSIS — Z299 Encounter for prophylactic measures, unspecified: Secondary | ICD-10-CM | POA: Diagnosis not present

## 2016-07-05 DIAGNOSIS — Z6841 Body Mass Index (BMI) 40.0 and over, adult: Secondary | ICD-10-CM | POA: Diagnosis not present

## 2016-07-24 DIAGNOSIS — J449 Chronic obstructive pulmonary disease, unspecified: Secondary | ICD-10-CM | POA: Diagnosis not present

## 2016-07-24 DIAGNOSIS — E7800 Pure hypercholesterolemia, unspecified: Secondary | ICD-10-CM | POA: Diagnosis not present

## 2016-07-24 DIAGNOSIS — I639 Cerebral infarction, unspecified: Secondary | ICD-10-CM | POA: Diagnosis not present

## 2016-07-24 DIAGNOSIS — I10 Essential (primary) hypertension: Secondary | ICD-10-CM | POA: Diagnosis not present

## 2016-08-16 ENCOUNTER — Ambulatory Visit (INDEPENDENT_AMBULATORY_CARE_PROVIDER_SITE_OTHER): Payer: Medicare Other | Admitting: Cardiovascular Disease

## 2016-08-16 ENCOUNTER — Encounter: Payer: Self-pay | Admitting: Cardiovascular Disease

## 2016-08-16 VITALS — BP 127/76 | HR 61 | Ht 73.0 in | Wt 305.0 lb

## 2016-08-16 DIAGNOSIS — I2510 Atherosclerotic heart disease of native coronary artery without angina pectoris: Secondary | ICD-10-CM | POA: Diagnosis not present

## 2016-08-16 DIAGNOSIS — E7800 Pure hypercholesterolemia, unspecified: Secondary | ICD-10-CM

## 2016-08-16 DIAGNOSIS — I2583 Coronary atherosclerosis due to lipid rich plaque: Secondary | ICD-10-CM | POA: Diagnosis not present

## 2016-08-16 DIAGNOSIS — I779 Disorder of arteries and arterioles, unspecified: Secondary | ICD-10-CM

## 2016-08-16 DIAGNOSIS — I10 Essential (primary) hypertension: Secondary | ICD-10-CM

## 2016-08-16 DIAGNOSIS — I739 Peripheral vascular disease, unspecified: Secondary | ICD-10-CM

## 2016-08-16 NOTE — Patient Instructions (Signed)
Your physician wants you to follow-up in: 1 YEAR WITH DR KONESWARAN You will receive a reminder letter in the mail two months in advance. If you don't receive a letter, please call our office to schedule the follow-up appointment.  Your physician recommends that you continue on your current medications as directed. Please refer to the Current Medication list given to you today.  Thank you for choosing [facility]!!    

## 2016-08-16 NOTE — Progress Notes (Signed)
SUBJECTIVE: The patient presents for routine follow-up. He has a history of mild nonobstructive coronary artery disease as per cardiac catheterization performed in 2005. He underwent a low risk nuclear stress test in 2013. He also has a history of TIA and carotid artery disease.  He underwent an exercise treadmill stress test on 03/25/15 at [facility]. He exercised for 4 minutes and 57 seconds achieving stage II of the Bruce protocol and completed 7 minutes. He was hypertensive throughout the study with a peak blood pressure 224/73.  ECG performed in the office today which I ordered and personally interpreted demonstrates normal sinus rhythm with no ischemic ST segment or T-wave abnormalities, nor any arrhythmias.  The patient denies any symptoms of chest pain, palpitations, shortness of breath, lightheadedness, dizziness, leg swelling, orthopnea, PND, and syncope.  He has become very strict about his diet and eats very little red meat and eats a lot of salad, chicken, fish, and greens. He walks 1.5 hours daily. He was recently diagnosed with type 2 diabetes and is trying to control it with diet and exercise.   Review of Systems: As per "subjective", otherwise negative.  No Known Allergies  Current Outpatient Prescriptions  Medication Sig Dispense Refill  . aspirin EC 81 MG tablet Take 1 tablet (81 mg total) by mouth daily.    Marland Kitchen lisinopril (PRINIVIL,ZESTRIL) 10 MG tablet Take 10 mg by mouth daily.    Marland Kitchen lovastatin (MEVACOR) 40 MG tablet Take 80 mg by mouth at bedtime.     Marland Kitchen omeprazole (PRILOSEC) 40 MG capsule Take 1 capsule by mouth daily.    . tamsulosin (FLOMAX) 0.4 MG CAPS capsule Take 1 capsule by mouth at bedtime.     No current facility-administered medications for this visit.     Past Medical History:  Diagnosis Date  . CAD (coronary artery disease)    Mild plaque 2005  . Carotid artery disease ([place])   . Diabetes mellitus without complication ([place])   . DJD  (degenerative joint disease)   . HLD (hyperlipidemia)   . Hypertension   . Syncope   . TIA (transient ischemic attack)     Past Surgical History:  Procedure Laterality Date  . CARDIAC CATHETERIZATION    . None      Social History   Social History  . Marital status: Married    Spouse name: N/A  . Number of children: N/A  . Years of education: N/A   Occupational History  . Disabled    Social History Main Topics  . Smoking status: Former Smoker    Packs/day: 2.00    Years: 37.00    Types: Cigarettes    Start date: 04/02/1970    Quit date: 02/21/2007  . Smokeless tobacco: Never Used  . Alcohol use No  . Drug use: No  . Sexual activity: Not on file   Other Topics Concern  . Not on file   Social History Narrative   Regular exercise: Yes     Vitals:   08/16/16 1019  BP: 127/76  Pulse: 61  SpO2: 97%  Weight: (!) 305 lb (138.3 kg)  Height: 6\' 1"  (1.854 m)    Wt Readings from Last 3 Encounters:  08/16/16 (!) 305 lb (138.3 kg)  05/22/16 (!) 316 lb (143.3 kg)  01/17/16 (!) 313 lb (142 kg)     PHYSICAL EXAM General: NAD HEENT: Normal. Neck: No JVD, no thyromegaly. Lungs: Clear to auscultation bilaterally with normal respiratory effort. CV: Nondisplaced PMI.  Regular rate and rhythm, normal S1/S2, no S3/S4, no murmur. No pretibial or periankle edema.  No carotid bruit.   Abdomen: Protuberant.  Neurologic: Alert and oriented.  Psych: Normal affect. Skin: Normal. Musculoskeletal: No gross deformities.    ECG: Most recent ECG reviewed.   Labs: No results found for: K, BUN, CREATININE, ALT, TSH, HGB   Lipids: No results found for: LDLCALC, LDLDIRECT, CHOL, TRIG, HDL     ASSESSMENT AND PLAN: CAD, NATIVE VESSEL  Symptomatically stable. No ischemia by stress testing and for break 2017. Continue risk factor modification with diet and exercise. Will continue aspirin and statin. He is no longer on a beta blocker. HR 59-61 bpm.  Essential hypertension   Controlled on lisinopril 10 mg. No changes.  Hyperlipidemia Remains on lovastatin 80 mg.  Carotid artery disease  Mild disease by Doppler study 12/2014 (1-39% b/l). Repeat in 3 years.    Disposition: Follow up 1 yr  Kate Sable, M.D., F.A.C.C.

## 2016-09-06 ENCOUNTER — Encounter: Payer: Medicare Other | Attending: Internal Medicine | Admitting: Nutrition

## 2016-09-06 ENCOUNTER — Telehealth: Payer: Self-pay | Admitting: Nutrition

## 2016-09-06 DIAGNOSIS — E119 Type 2 diabetes mellitus without complications: Secondary | ICD-10-CM | POA: Diagnosis not present

## 2016-09-06 DIAGNOSIS — Z713 Dietary counseling and surveillance: Secondary | ICD-10-CM | POA: Diagnosis not present

## 2016-09-06 DIAGNOSIS — E669 Obesity, unspecified: Secondary | ICD-10-CM | POA: Diagnosis not present

## 2016-09-06 NOTE — Patient Instructions (Signed)
Goals Keep up the great job Keep walking 2 miiles or more per day Continue to increase fresh fruits and vegetables. Losing 2--3 lbs per month Keep drinking water Snacks: Vegetables and nuts

## 2016-09-06 NOTE — Telephone Encounter (Signed)
Discussed results of labs from Dr. Trena Platt office that were faxed to my office over phone. A1C 5.8%.Marland Kitchen

## 2016-09-06 NOTE — Progress Notes (Signed)
  Medical Nutrition Therapy:  Appt start time: 1100  end time: 1130 Assessment:  Primary concerns today: Diabetes Type 2-diet controlled and obesity.  A1C done in May but doesn't have results fom Dr. Manuella Ghazi office. He notes DR told him his A1C was good. He has cut out a lot snacks. Will eat nuts or vegetables if hungry. Sticking to meal times and not eating too much at meals. Increased physical actviity by walking  2 miles a day at least...  BP and cholesterol much better he reports.Feels good and good a good report from his cardiologist per his report. Has cut out using salt and processed foods.  Lost 20 lbs since April 2018. Making excellent progress. Would like to see him lose another 20 lbs in the next year.    Preferred Learning Style:   No preference indicated   Learning Readiness:  Ready  Change in progress   MEDICATIONS: see list   DIETARY INTAKE:.    24-hr recall:  B ( AM):1   Eggs, toast 1 slice,  3/4 Whole bran cereal,  And 1 cup milk, banana 1/2. L)  Taco salad, water Dinner: Beef Roast, carrots, onions, mixed beans, water Apple Usual physical activity: ADL  Estimated energy needs: 1800 calories 200 g carbohydrates  135 g protein 50 g fat  Progress Towards Goal(s):  In progress.   Nutritional Diagnosis:  NB-1.1 Food and nutrition-related knowledge deficit As related to Prediabetes, CKD, HTN and hyperlipidemia.  As evidenced by A1C 6.2%, Urine Creatine 50 and  low HDL 27. .    Intervention:  Nutrition and Diabetes education provided on My Plate, CHO counting, meal planning, portion sizes, timing of meals, avoiding snacks between meals unless having a low blood sugar, target ranges for A1C and blood sugars, signs/symptoms and treatment of hyper/hypoglycemia, monitoring blood sugars, taking medications as prescribed, benefits of exercising 30 minutes per day and prevention of complications of DM. Low Sodium Low Fat High Fiber diet.  Goals Keep up the great job Keep  walking 2 miiles or more per day Continue to increase fresh fruits and vegetables. Losing 2--3 lbs per month Keep drinking water Snacks: Vegetables and nuts  Keep A1C less 5.7% or less.  Teaching Method Utilized:  Visual Auditory Hands on  Handouts given during visit include:  The Plate Method   Meal Plan Card  Diabetes Instructions.   Barriers to learning/adherence to lifestyle change: none  Demonstrated degree of understanding via:  Teach Back   Monitoring/Evaluation:  Dietary intake, exercise, meal planning, SBG, and body weight in 3-4 month(s).

## 2016-10-11 DIAGNOSIS — J449 Chronic obstructive pulmonary disease, unspecified: Secondary | ICD-10-CM | POA: Diagnosis not present

## 2016-10-11 DIAGNOSIS — I639 Cerebral infarction, unspecified: Secondary | ICD-10-CM | POA: Diagnosis not present

## 2016-10-11 DIAGNOSIS — Z299 Encounter for prophylactic measures, unspecified: Secondary | ICD-10-CM | POA: Diagnosis not present

## 2016-10-11 DIAGNOSIS — Z713 Dietary counseling and surveillance: Secondary | ICD-10-CM | POA: Diagnosis not present

## 2016-10-11 DIAGNOSIS — I10 Essential (primary) hypertension: Secondary | ICD-10-CM | POA: Diagnosis not present

## 2016-10-11 DIAGNOSIS — E7800 Pure hypercholesterolemia, unspecified: Secondary | ICD-10-CM | POA: Diagnosis not present

## 2016-10-11 DIAGNOSIS — G4730 Sleep apnea, unspecified: Secondary | ICD-10-CM | POA: Diagnosis not present

## 2016-10-11 DIAGNOSIS — Z6841 Body Mass Index (BMI) 40.0 and over, adult: Secondary | ICD-10-CM | POA: Diagnosis not present

## 2016-10-11 DIAGNOSIS — E1165 Type 2 diabetes mellitus with hyperglycemia: Secondary | ICD-10-CM | POA: Diagnosis not present

## 2016-10-29 DIAGNOSIS — Z87891 Personal history of nicotine dependence: Secondary | ICD-10-CM | POA: Diagnosis not present

## 2016-10-29 DIAGNOSIS — E7800 Pure hypercholesterolemia, unspecified: Secondary | ICD-10-CM | POA: Diagnosis not present

## 2016-10-29 DIAGNOSIS — T63461A Toxic effect of venom of wasps, accidental (unintentional), initial encounter: Secondary | ICD-10-CM | POA: Diagnosis not present

## 2016-10-29 DIAGNOSIS — K219 Gastro-esophageal reflux disease without esophagitis: Secondary | ICD-10-CM | POA: Diagnosis not present

## 2016-10-29 DIAGNOSIS — I10 Essential (primary) hypertension: Secondary | ICD-10-CM | POA: Diagnosis not present

## 2016-10-29 DIAGNOSIS — Z79899 Other long term (current) drug therapy: Secondary | ICD-10-CM | POA: Diagnosis not present

## 2016-11-20 DIAGNOSIS — I639 Cerebral infarction, unspecified: Secondary | ICD-10-CM | POA: Diagnosis not present

## 2016-11-20 DIAGNOSIS — E7800 Pure hypercholesterolemia, unspecified: Secondary | ICD-10-CM | POA: Diagnosis not present

## 2016-11-20 DIAGNOSIS — I10 Essential (primary) hypertension: Secondary | ICD-10-CM | POA: Diagnosis not present

## 2016-11-20 DIAGNOSIS — J449 Chronic obstructive pulmonary disease, unspecified: Secondary | ICD-10-CM | POA: Diagnosis not present

## 2016-11-30 DIAGNOSIS — J449 Chronic obstructive pulmonary disease, unspecified: Secondary | ICD-10-CM | POA: Diagnosis not present

## 2016-11-30 DIAGNOSIS — K219 Gastro-esophageal reflux disease without esophagitis: Secondary | ICD-10-CM | POA: Diagnosis not present

## 2016-11-30 DIAGNOSIS — Z6841 Body Mass Index (BMI) 40.0 and over, adult: Secondary | ICD-10-CM | POA: Diagnosis not present

## 2016-11-30 DIAGNOSIS — J069 Acute upper respiratory infection, unspecified: Secondary | ICD-10-CM | POA: Diagnosis not present

## 2016-11-30 DIAGNOSIS — I639 Cerebral infarction, unspecified: Secondary | ICD-10-CM | POA: Diagnosis not present

## 2016-11-30 DIAGNOSIS — Z299 Encounter for prophylactic measures, unspecified: Secondary | ICD-10-CM | POA: Diagnosis not present

## 2016-12-16 DIAGNOSIS — Z87891 Personal history of nicotine dependence: Secondary | ICD-10-CM | POA: Diagnosis not present

## 2016-12-16 DIAGNOSIS — K219 Gastro-esophageal reflux disease without esophagitis: Secondary | ICD-10-CM | POA: Diagnosis not present

## 2016-12-16 DIAGNOSIS — S61210A Laceration without foreign body of right index finger without damage to nail, initial encounter: Secondary | ICD-10-CM | POA: Diagnosis not present

## 2016-12-16 DIAGNOSIS — Z7982 Long term (current) use of aspirin: Secondary | ICD-10-CM | POA: Diagnosis not present

## 2016-12-16 DIAGNOSIS — Z79899 Other long term (current) drug therapy: Secondary | ICD-10-CM | POA: Diagnosis not present

## 2016-12-16 DIAGNOSIS — E119 Type 2 diabetes mellitus without complications: Secondary | ICD-10-CM | POA: Diagnosis not present

## 2016-12-16 DIAGNOSIS — Z23 Encounter for immunization: Secondary | ICD-10-CM | POA: Diagnosis not present

## 2016-12-16 DIAGNOSIS — I10 Essential (primary) hypertension: Secondary | ICD-10-CM | POA: Diagnosis not present

## 2016-12-16 DIAGNOSIS — E7800 Pure hypercholesterolemia, unspecified: Secondary | ICD-10-CM | POA: Diagnosis not present

## 2016-12-22 DIAGNOSIS — Z23 Encounter for immunization: Secondary | ICD-10-CM | POA: Diagnosis not present

## 2016-12-29 DIAGNOSIS — J449 Chronic obstructive pulmonary disease, unspecified: Secondary | ICD-10-CM | POA: Diagnosis not present

## 2016-12-29 DIAGNOSIS — E7800 Pure hypercholesterolemia, unspecified: Secondary | ICD-10-CM | POA: Diagnosis not present

## 2016-12-29 DIAGNOSIS — I10 Essential (primary) hypertension: Secondary | ICD-10-CM | POA: Diagnosis not present

## 2016-12-29 DIAGNOSIS — I639 Cerebral infarction, unspecified: Secondary | ICD-10-CM | POA: Diagnosis not present

## 2017-01-23 DIAGNOSIS — R309 Painful micturition, unspecified: Secondary | ICD-10-CM | POA: Diagnosis not present

## 2017-01-23 DIAGNOSIS — Z299 Encounter for prophylactic measures, unspecified: Secondary | ICD-10-CM | POA: Diagnosis not present

## 2017-01-23 DIAGNOSIS — R300 Dysuria: Secondary | ICD-10-CM | POA: Diagnosis not present

## 2017-01-23 DIAGNOSIS — I639 Cerebral infarction, unspecified: Secondary | ICD-10-CM | POA: Diagnosis not present

## 2017-01-23 DIAGNOSIS — J449 Chronic obstructive pulmonary disease, unspecified: Secondary | ICD-10-CM | POA: Diagnosis not present

## 2017-01-23 DIAGNOSIS — I10 Essential (primary) hypertension: Secondary | ICD-10-CM | POA: Diagnosis not present

## 2017-01-23 DIAGNOSIS — Z6841 Body Mass Index (BMI) 40.0 and over, adult: Secondary | ICD-10-CM | POA: Diagnosis not present

## 2017-01-23 DIAGNOSIS — E1165 Type 2 diabetes mellitus with hyperglycemia: Secondary | ICD-10-CM | POA: Diagnosis not present

## 2017-01-24 DIAGNOSIS — J449 Chronic obstructive pulmonary disease, unspecified: Secondary | ICD-10-CM | POA: Diagnosis not present

## 2017-01-24 DIAGNOSIS — I639 Cerebral infarction, unspecified: Secondary | ICD-10-CM | POA: Diagnosis not present

## 2017-01-24 DIAGNOSIS — E7800 Pure hypercholesterolemia, unspecified: Secondary | ICD-10-CM | POA: Diagnosis not present

## 2017-01-24 DIAGNOSIS — I10 Essential (primary) hypertension: Secondary | ICD-10-CM | POA: Diagnosis not present

## 2017-01-26 DIAGNOSIS — Z6841 Body Mass Index (BMI) 40.0 and over, adult: Secondary | ICD-10-CM | POA: Diagnosis not present

## 2017-01-26 DIAGNOSIS — Z299 Encounter for prophylactic measures, unspecified: Secondary | ICD-10-CM | POA: Diagnosis not present

## 2017-01-26 DIAGNOSIS — N400 Enlarged prostate without lower urinary tract symptoms: Secondary | ICD-10-CM | POA: Diagnosis not present

## 2017-01-26 DIAGNOSIS — I10 Essential (primary) hypertension: Secondary | ICD-10-CM | POA: Diagnosis not present

## 2017-01-26 DIAGNOSIS — G4730 Sleep apnea, unspecified: Secondary | ICD-10-CM | POA: Diagnosis not present

## 2017-02-04 DIAGNOSIS — R197 Diarrhea, unspecified: Secondary | ICD-10-CM | POA: Diagnosis not present

## 2017-02-04 DIAGNOSIS — R110 Nausea: Secondary | ICD-10-CM | POA: Diagnosis not present

## 2017-02-04 DIAGNOSIS — Z87891 Personal history of nicotine dependence: Secondary | ICD-10-CM | POA: Diagnosis not present

## 2017-02-04 DIAGNOSIS — E7800 Pure hypercholesterolemia, unspecified: Secondary | ICD-10-CM | POA: Diagnosis not present

## 2017-02-04 DIAGNOSIS — I10 Essential (primary) hypertension: Secondary | ICD-10-CM | POA: Diagnosis not present

## 2017-02-04 DIAGNOSIS — Z79899 Other long term (current) drug therapy: Secondary | ICD-10-CM | POA: Diagnosis not present

## 2017-02-04 DIAGNOSIS — K219 Gastro-esophageal reflux disease without esophagitis: Secondary | ICD-10-CM | POA: Diagnosis not present

## 2017-02-06 ENCOUNTER — Ambulatory Visit: Payer: Medicare Other | Admitting: Nutrition

## 2017-02-09 DIAGNOSIS — J449 Chronic obstructive pulmonary disease, unspecified: Secondary | ICD-10-CM | POA: Diagnosis not present

## 2017-02-09 DIAGNOSIS — I10 Essential (primary) hypertension: Secondary | ICD-10-CM | POA: Diagnosis not present

## 2017-02-09 DIAGNOSIS — Z6841 Body Mass Index (BMI) 40.0 and over, adult: Secondary | ICD-10-CM | POA: Diagnosis not present

## 2017-02-09 DIAGNOSIS — R110 Nausea: Secondary | ICD-10-CM | POA: Diagnosis not present

## 2017-02-09 DIAGNOSIS — R112 Nausea with vomiting, unspecified: Secondary | ICD-10-CM | POA: Diagnosis not present

## 2017-02-09 DIAGNOSIS — Z299 Encounter for prophylactic measures, unspecified: Secondary | ICD-10-CM | POA: Diagnosis not present

## 2017-03-14 DIAGNOSIS — J449 Chronic obstructive pulmonary disease, unspecified: Secondary | ICD-10-CM | POA: Diagnosis not present

## 2017-03-14 DIAGNOSIS — G4730 Sleep apnea, unspecified: Secondary | ICD-10-CM | POA: Diagnosis not present

## 2017-03-14 DIAGNOSIS — Z6841 Body Mass Index (BMI) 40.0 and over, adult: Secondary | ICD-10-CM | POA: Diagnosis not present

## 2017-03-14 DIAGNOSIS — Z87891 Personal history of nicotine dependence: Secondary | ICD-10-CM | POA: Diagnosis not present

## 2017-03-14 DIAGNOSIS — Z299 Encounter for prophylactic measures, unspecified: Secondary | ICD-10-CM | POA: Diagnosis not present

## 2017-03-14 DIAGNOSIS — I10 Essential (primary) hypertension: Secondary | ICD-10-CM | POA: Diagnosis not present

## 2017-03-14 DIAGNOSIS — E1165 Type 2 diabetes mellitus with hyperglycemia: Secondary | ICD-10-CM | POA: Diagnosis not present

## 2017-03-20 ENCOUNTER — Ambulatory Visit: Payer: Medicare Other | Admitting: Nutrition

## 2017-04-17 ENCOUNTER — Encounter: Payer: Self-pay | Admitting: Nutrition

## 2017-04-17 ENCOUNTER — Encounter: Payer: Medicare Other | Attending: Internal Medicine | Admitting: Nutrition

## 2017-04-17 DIAGNOSIS — E1165 Type 2 diabetes mellitus with hyperglycemia: Secondary | ICD-10-CM | POA: Insufficient documentation

## 2017-04-17 DIAGNOSIS — Z713 Dietary counseling and surveillance: Secondary | ICD-10-CM | POA: Insufficient documentation

## 2017-04-17 DIAGNOSIS — R739 Hyperglycemia, unspecified: Secondary | ICD-10-CM

## 2017-04-17 NOTE — Progress Notes (Signed)
  Medical Nutrition Therapy:  Appt start time: 1100  end time: 1130 Assessment:  Primary concerns today: Diabetes Type 2-diet controlled and obesity.   Gained 25 lbs in the last 6 months. HIs wife has been cooking for grandkids and diet has been higher fat, salt and lower in fresh fruits and vegetables. Admits to snacking at night.  Not getting any regular routine exercise but will start walking on treadmill.   FBS  96-114 mg/dl. No A1C available for review. Not on any meds for diabetes.  05/10/16 Chol 147 mg/dl, HDL low at 35.    He reports he is ready to get back to eating healthy and start exercising to lose weight and feel better. Needs a high fiber lower fat low sodium diet.  Preferred Learning Style:   No preference indicated   Learning Readiness:  Ready  Change in progress   MEDICATIONS: see list   DIETARY INTAKE:.    24-hr recall:  B ( AM):1   Eggs, toast 1 slice,  3/4 Whole bran cereal,  And 1 cup milk, banana 1/2. L)  Beanie weanies, pack nabs, fruit, water  Dinner:CHicken, baked potato, green beans, water Apple Usual physical activity: ADL  Estimated energy needs: 1800 calories 200 g carbohydrates  135 g protein 50 g fat  Progress Towards Goal(s):  In progress.   Nutritional Diagnosis:  NB-1.1 Food and nutrition-related knowledge deficit As related to Prediabetes, CKD, HTN and hyperlipidemia.  As evidenced by A1C 6.2%, Urine Creatine 50 and  low HDL 27. .    Intervention:  Nutrition and Diabetes education provided on My Plate, CHO counting, meal planning, portion sizes, timing of meals, avoiding snacks between meals unless having a low blood sugar, target ranges for A1C and blood sugars, signs/symptoms and treatment of hyper/hypoglycemia, monitoring blood sugars, taking medications as prescribed, benefits of exercising 30 minutes per day and prevention of complications of DM. Low Sodium Low Fat High Fiber diet.   Goals 1. Eat three balanced meals per day 2.  Increse fresh fruits and vegetables. 3. CUt out snacks 4. Increase watlking to 15 minutes  A day Eat baked and boiled foods. Lose 3-4 lbs per month Teaching Method Utilized:  Visual Auditory Hands on  Handouts given during visit include:  The Plate Method   Meal Plan Card  Diabetes Instructions.   Barriers to learning/adherence to lifestyle change: none  Demonstrated degree of understanding via:  Teach Back   Monitoring/Evaluation:  Dietary intake, exercise, meal planning, SBG, and body weight in 3-4 month(s).  Check an A1C level.

## 2017-04-17 NOTE — Patient Instructions (Addendum)
Goals 1. Eat three balanced meals per day 2. Increse fresh fruits and vegetables. 3. CUt out snacks 4. Increase watlking to 15 minutes  A day Eat baked and boiled foods. Lose 3-4 lbs per month

## 2017-04-19 DIAGNOSIS — I639 Cerebral infarction, unspecified: Secondary | ICD-10-CM | POA: Diagnosis not present

## 2017-04-19 DIAGNOSIS — J449 Chronic obstructive pulmonary disease, unspecified: Secondary | ICD-10-CM | POA: Diagnosis not present

## 2017-04-19 DIAGNOSIS — E7800 Pure hypercholesterolemia, unspecified: Secondary | ICD-10-CM | POA: Diagnosis not present

## 2017-04-19 DIAGNOSIS — I10 Essential (primary) hypertension: Secondary | ICD-10-CM | POA: Diagnosis not present

## 2017-04-30 DIAGNOSIS — B078 Other viral warts: Secondary | ICD-10-CM | POA: Diagnosis not present

## 2017-04-30 DIAGNOSIS — C44612 Basal cell carcinoma of skin of right upper limb, including shoulder: Secondary | ICD-10-CM | POA: Diagnosis not present

## 2017-05-07 DIAGNOSIS — J449 Chronic obstructive pulmonary disease, unspecified: Secondary | ICD-10-CM | POA: Diagnosis not present

## 2017-05-07 DIAGNOSIS — I10 Essential (primary) hypertension: Secondary | ICD-10-CM | POA: Diagnosis not present

## 2017-05-07 DIAGNOSIS — I639 Cerebral infarction, unspecified: Secondary | ICD-10-CM | POA: Diagnosis not present

## 2017-05-07 DIAGNOSIS — E7800 Pure hypercholesterolemia, unspecified: Secondary | ICD-10-CM | POA: Diagnosis not present

## 2017-05-15 DIAGNOSIS — Z1331 Encounter for screening for depression: Secondary | ICD-10-CM | POA: Diagnosis not present

## 2017-05-15 DIAGNOSIS — Z1339 Encounter for screening examination for other mental health and behavioral disorders: Secondary | ICD-10-CM | POA: Diagnosis not present

## 2017-05-15 DIAGNOSIS — Z7189 Other specified counseling: Secondary | ICD-10-CM | POA: Diagnosis not present

## 2017-05-15 DIAGNOSIS — Z79899 Other long term (current) drug therapy: Secondary | ICD-10-CM | POA: Diagnosis not present

## 2017-05-15 DIAGNOSIS — R5383 Other fatigue: Secondary | ICD-10-CM | POA: Diagnosis not present

## 2017-05-15 DIAGNOSIS — Z6841 Body Mass Index (BMI) 40.0 and over, adult: Secondary | ICD-10-CM | POA: Diagnosis not present

## 2017-05-15 DIAGNOSIS — I639 Cerebral infarction, unspecified: Secondary | ICD-10-CM | POA: Diagnosis not present

## 2017-05-15 DIAGNOSIS — E1165 Type 2 diabetes mellitus with hyperglycemia: Secondary | ICD-10-CM | POA: Diagnosis not present

## 2017-05-15 DIAGNOSIS — Z1211 Encounter for screening for malignant neoplasm of colon: Secondary | ICD-10-CM | POA: Diagnosis not present

## 2017-05-15 DIAGNOSIS — Z0000 Encounter for general adult medical examination without abnormal findings: Secondary | ICD-10-CM | POA: Diagnosis not present

## 2017-05-15 DIAGNOSIS — I10 Essential (primary) hypertension: Secondary | ICD-10-CM | POA: Diagnosis not present

## 2017-05-15 DIAGNOSIS — Z299 Encounter for prophylactic measures, unspecified: Secondary | ICD-10-CM | POA: Diagnosis not present

## 2017-05-23 DIAGNOSIS — Z6841 Body Mass Index (BMI) 40.0 and over, adult: Secondary | ICD-10-CM | POA: Diagnosis not present

## 2017-05-23 DIAGNOSIS — M545 Low back pain: Secondary | ICD-10-CM | POA: Diagnosis not present

## 2017-05-23 DIAGNOSIS — G4730 Sleep apnea, unspecified: Secondary | ICD-10-CM | POA: Diagnosis not present

## 2017-05-23 DIAGNOSIS — I10 Essential (primary) hypertension: Secondary | ICD-10-CM | POA: Diagnosis not present

## 2017-05-23 DIAGNOSIS — Z299 Encounter for prophylactic measures, unspecified: Secondary | ICD-10-CM | POA: Diagnosis not present

## 2017-05-23 DIAGNOSIS — J449 Chronic obstructive pulmonary disease, unspecified: Secondary | ICD-10-CM | POA: Diagnosis not present

## 2017-06-14 ENCOUNTER — Ambulatory Visit: Payer: Medicare Other | Admitting: Nutrition

## 2017-07-18 DIAGNOSIS — I10 Essential (primary) hypertension: Secondary | ICD-10-CM | POA: Diagnosis not present

## 2017-07-18 DIAGNOSIS — J449 Chronic obstructive pulmonary disease, unspecified: Secondary | ICD-10-CM | POA: Diagnosis not present

## 2017-07-18 DIAGNOSIS — E7800 Pure hypercholesterolemia, unspecified: Secondary | ICD-10-CM | POA: Diagnosis not present

## 2017-07-18 DIAGNOSIS — I639 Cerebral infarction, unspecified: Secondary | ICD-10-CM | POA: Diagnosis not present

## 2017-08-13 DIAGNOSIS — J449 Chronic obstructive pulmonary disease, unspecified: Secondary | ICD-10-CM | POA: Diagnosis not present

## 2017-08-13 DIAGNOSIS — E7800 Pure hypercholesterolemia, unspecified: Secondary | ICD-10-CM | POA: Diagnosis not present

## 2017-08-13 DIAGNOSIS — I10 Essential (primary) hypertension: Secondary | ICD-10-CM | POA: Diagnosis not present

## 2017-08-13 DIAGNOSIS — I639 Cerebral infarction, unspecified: Secondary | ICD-10-CM | POA: Diagnosis not present

## 2017-08-16 ENCOUNTER — Ambulatory Visit: Payer: Medicare Other | Admitting: Nutrition

## 2017-08-20 DIAGNOSIS — E1165 Type 2 diabetes mellitus with hyperglycemia: Secondary | ICD-10-CM | POA: Diagnosis not present

## 2017-08-20 DIAGNOSIS — I10 Essential (primary) hypertension: Secondary | ICD-10-CM | POA: Diagnosis not present

## 2017-08-20 DIAGNOSIS — Z299 Encounter for prophylactic measures, unspecified: Secondary | ICD-10-CM | POA: Diagnosis not present

## 2017-08-20 DIAGNOSIS — Z6841 Body Mass Index (BMI) 40.0 and over, adult: Secondary | ICD-10-CM | POA: Diagnosis not present

## 2017-08-20 DIAGNOSIS — J449 Chronic obstructive pulmonary disease, unspecified: Secondary | ICD-10-CM | POA: Diagnosis not present

## 2017-08-30 DIAGNOSIS — Z85828 Personal history of other malignant neoplasm of skin: Secondary | ICD-10-CM | POA: Diagnosis not present

## 2017-08-30 DIAGNOSIS — L573 Poikiloderma of Civatte: Secondary | ICD-10-CM | POA: Diagnosis not present

## 2017-09-10 DIAGNOSIS — E119 Type 2 diabetes mellitus without complications: Secondary | ICD-10-CM | POA: Diagnosis not present

## 2017-09-10 DIAGNOSIS — N400 Enlarged prostate without lower urinary tract symptoms: Secondary | ICD-10-CM | POA: Diagnosis not present

## 2017-09-10 DIAGNOSIS — J449 Chronic obstructive pulmonary disease, unspecified: Secondary | ICD-10-CM | POA: Diagnosis not present

## 2017-09-10 DIAGNOSIS — G4730 Sleep apnea, unspecified: Secondary | ICD-10-CM | POA: Diagnosis not present

## 2017-09-10 DIAGNOSIS — I2510 Atherosclerotic heart disease of native coronary artery without angina pectoris: Secondary | ICD-10-CM | POA: Diagnosis not present

## 2017-09-10 DIAGNOSIS — Z6841 Body Mass Index (BMI) 40.0 and over, adult: Secondary | ICD-10-CM | POA: Diagnosis not present

## 2017-09-10 DIAGNOSIS — Z8673 Personal history of transient ischemic attack (TIA), and cerebral infarction without residual deficits: Secondary | ICD-10-CM | POA: Diagnosis not present

## 2017-09-10 DIAGNOSIS — K219 Gastro-esophageal reflux disease without esophagitis: Secondary | ICD-10-CM | POA: Diagnosis not present

## 2017-09-10 DIAGNOSIS — E7800 Pure hypercholesterolemia, unspecified: Secondary | ICD-10-CM | POA: Diagnosis not present

## 2017-09-10 DIAGNOSIS — R079 Chest pain, unspecified: Secondary | ICD-10-CM | POA: Diagnosis not present

## 2017-09-10 DIAGNOSIS — I639 Cerebral infarction, unspecified: Secondary | ICD-10-CM | POA: Diagnosis not present

## 2017-09-10 DIAGNOSIS — Z299 Encounter for prophylactic measures, unspecified: Secondary | ICD-10-CM | POA: Diagnosis not present

## 2017-09-10 DIAGNOSIS — I10 Essential (primary) hypertension: Secondary | ICD-10-CM | POA: Diagnosis not present

## 2017-09-10 DIAGNOSIS — Z87891 Personal history of nicotine dependence: Secondary | ICD-10-CM | POA: Diagnosis not present

## 2017-09-11 ENCOUNTER — Encounter: Payer: Self-pay | Admitting: Cardiovascular Disease

## 2017-09-11 DIAGNOSIS — Z6841 Body Mass Index (BMI) 40.0 and over, adult: Secondary | ICD-10-CM | POA: Diagnosis not present

## 2017-09-11 DIAGNOSIS — R079 Chest pain, unspecified: Secondary | ICD-10-CM | POA: Diagnosis not present

## 2017-09-11 DIAGNOSIS — I2510 Atherosclerotic heart disease of native coronary artery without angina pectoris: Secondary | ICD-10-CM | POA: Diagnosis not present

## 2017-09-11 DIAGNOSIS — E119 Type 2 diabetes mellitus without complications: Secondary | ICD-10-CM | POA: Diagnosis not present

## 2017-09-11 DIAGNOSIS — E7800 Pure hypercholesterolemia, unspecified: Secondary | ICD-10-CM | POA: Diagnosis not present

## 2017-09-18 DIAGNOSIS — I10 Essential (primary) hypertension: Secondary | ICD-10-CM | POA: Diagnosis not present

## 2017-09-18 DIAGNOSIS — E1165 Type 2 diabetes mellitus with hyperglycemia: Secondary | ICD-10-CM | POA: Diagnosis not present

## 2017-09-18 DIAGNOSIS — Z6841 Body Mass Index (BMI) 40.0 and over, adult: Secondary | ICD-10-CM | POA: Diagnosis not present

## 2017-09-18 DIAGNOSIS — J449 Chronic obstructive pulmonary disease, unspecified: Secondary | ICD-10-CM | POA: Diagnosis not present

## 2017-09-18 DIAGNOSIS — Z299 Encounter for prophylactic measures, unspecified: Secondary | ICD-10-CM | POA: Diagnosis not present

## 2017-09-20 DIAGNOSIS — M545 Low back pain: Secondary | ICD-10-CM | POA: Diagnosis not present

## 2017-09-20 DIAGNOSIS — I10 Essential (primary) hypertension: Secondary | ICD-10-CM | POA: Diagnosis not present

## 2017-09-20 DIAGNOSIS — Z6841 Body Mass Index (BMI) 40.0 and over, adult: Secondary | ICD-10-CM | POA: Diagnosis not present

## 2017-09-20 DIAGNOSIS — R350 Frequency of micturition: Secondary | ICD-10-CM | POA: Diagnosis not present

## 2017-09-20 DIAGNOSIS — Z299 Encounter for prophylactic measures, unspecified: Secondary | ICD-10-CM | POA: Diagnosis not present

## 2017-09-24 ENCOUNTER — Encounter: Payer: Self-pay | Admitting: *Deleted

## 2017-09-25 ENCOUNTER — Encounter: Payer: Self-pay | Admitting: *Deleted

## 2017-09-25 ENCOUNTER — Encounter: Payer: Self-pay | Admitting: Cardiovascular Disease

## 2017-09-25 ENCOUNTER — Telehealth: Payer: Self-pay | Admitting: Cardiovascular Disease

## 2017-09-25 ENCOUNTER — Ambulatory Visit (INDEPENDENT_AMBULATORY_CARE_PROVIDER_SITE_OTHER): Payer: Medicare Other | Admitting: Cardiovascular Disease

## 2017-09-25 VITALS — BP 152/62 | HR 64 | Ht 72.0 in | Wt 322.0 lb

## 2017-09-25 DIAGNOSIS — I739 Peripheral vascular disease, unspecified: Secondary | ICD-10-CM

## 2017-09-25 DIAGNOSIS — Z9289 Personal history of other medical treatment: Secondary | ICD-10-CM | POA: Diagnosis not present

## 2017-09-25 DIAGNOSIS — R079 Chest pain, unspecified: Secondary | ICD-10-CM

## 2017-09-25 DIAGNOSIS — I779 Disorder of arteries and arterioles, unspecified: Secondary | ICD-10-CM | POA: Diagnosis not present

## 2017-09-25 DIAGNOSIS — I2510 Atherosclerotic heart disease of native coronary artery without angina pectoris: Secondary | ICD-10-CM | POA: Diagnosis not present

## 2017-09-25 DIAGNOSIS — I2583 Coronary atherosclerosis due to lipid rich plaque: Secondary | ICD-10-CM | POA: Diagnosis not present

## 2017-09-25 DIAGNOSIS — I10 Essential (primary) hypertension: Secondary | ICD-10-CM | POA: Diagnosis not present

## 2017-09-25 DIAGNOSIS — E785 Hyperlipidemia, unspecified: Secondary | ICD-10-CM | POA: Diagnosis not present

## 2017-09-25 NOTE — Progress Notes (Signed)
SUBJECTIVE: The patient presents for routine follow-up. He has a history of mild nonobstructive coronary artery disease as per cardiac catheterization performed in 2005. He underwent a low risk nuclear stress test in 2013. He also has a history of TIA and carotid artery disease.  He underwent an exercise treadmill stress test on 03/25/15 at [facility]. He exercised for 4 minutes and 57 seconds achieving stage II of the Bruce protocol and completed 7 minutes. He was hypertensive throughout the study with a peak blood pressure 224/73.  He was recently hospitalized for chest pain, left arm numbness, and shortness of breath in July 2019.  I reviewed all hospitalization records, labs, and studies.  He told me he had about 30 seconds of chest pain which spontaneously resolved.  He had some mild associated shortness of breath which also resolved fairly quickly.  He did have some left arm numbness and heaviness which took about 5 hours to resolve.  He believes it was all due to working for too long in the heat and humidity.  Troponins were normal.  Additional labs: BUN 14, creatinine 0.94, white blood cell 7.3, hemoglobin 15.  He will been A1c was 5.8% on 08/20/2017.  He told me he has been eating poorly and this is why he put on a lot of weight.  He has recently modified his diet and is trying to eat more baked foods.  He currently denies any chest pain, palpitations, shortness of breath.  He did strain a back muscle recently and is on a muscle relaxant and pain medication.  He says this is why his blood pressure is elevated today.     Review of Systems: As per "subjective", otherwise negative.  No Known Allergies  Current Outpatient Medications  Medication Sig Dispense Refill  . aspirin EC 81 MG tablet Take 1 tablet (81 mg total) by mouth daily.    Marland Kitchen lisinopril (PRINIVIL,ZESTRIL) 10 MG tablet Take 10 mg by mouth daily.    Marland Kitchen lovastatin (MEVACOR) 40 MG tablet Take 80 mg by mouth at  bedtime.     Marland Kitchen omeprazole (PRILOSEC) 40 MG capsule Take 1 capsule by mouth daily.    . tamsulosin (FLOMAX) 0.4 MG CAPS capsule Take 1 capsule by mouth at bedtime.     No current facility-administered medications for this visit.     Past Medical History:  Diagnosis Date  . CAD (coronary artery disease)    Mild plaque 2005  . Carotid artery disease ([place])   . Diabetes mellitus without complication ([place])   . DJD (degenerative joint disease)   . HLD (hyperlipidemia)   . Hypertension   . Syncope   . TIA (transient ischemic attack)     Past Surgical History:  Procedure Laterality Date  . CARDIAC CATHETERIZATION    . None      Social History   Socioeconomic History  . Marital status: Married    Spouse name: Not on file  . Number of children: Not on file  . Years of education: Not on file  . Highest education level: Not on file  Occupational History  . Occupation: Disabled  Social Needs  . Financial resource strain: Not on file  . Food insecurity:    Worry: Not on file    Inability: Not on file  . Transportation needs:    Medical: Not on file    Non-medical: Not on file  Tobacco Use  . Smoking status: Former Smoker    Packs/day: 2.00  Years: 37.00    Pack years: 74.00    Types: Cigarettes    Start date: 04/02/1970    Last attempt to quit: 02/21/2007    Years since quitting: 10.6  . Smokeless tobacco: Never Used  Substance and Sexual Activity  . Alcohol use: No    Alcohol/week: 0.0 oz  . Drug use: No  . Sexual activity: Not on file  Lifestyle  . Physical activity:    Days per week: Not on file    Minutes per session: Not on file  . Stress: Not on file  Relationships  . Social connections:    Talks on phone: Not on file    Gets together: Not on file    Attends religious service: Not on file    Active member of club or organization: Not on file    Attends meetings of clubs or organizations: Not on file    Relationship status: Not on file  . Intimate partner  violence:    Fear of current or ex partner: Not on file    Emotionally abused: Not on file    Physically abused: Not on file    Forced sexual activity: Not on file  Other Topics Concern  . Not on file  Social History Narrative   Regular exercise: Yes     Vitals:   09/25/17 0851  BP: (!) 152/62  Pulse: 64  SpO2: 98%  Weight: (!) 322 lb (146.1 kg)  Height: 6' (1.829 m)    Wt Readings from Last 3 Encounters:  09/25/17 (!) 322 lb (146.1 kg)  04/17/17 (!) 321 lb (145.6 kg)  09/06/16 296 lb (134.3 kg)     PHYSICAL EXAM General: NAD HEENT: Normal. Neck: No JVD, no thyromegaly. Lungs: Clear to auscultation bilaterally with normal respiratory effort. CV: Regular rate and rhythm, normal S1/S2, no S3/S4, no murmur. No pretibial or periankle edema.  No carotid bruit.   Abdomen: Firm, obese, nontender, no distention.  Neurologic: Alert and oriented.  Psych: Normal affect. Skin: Normal. Musculoskeletal: No gross deformities.    ECG: Reviewed above under Subjective   Labs: No results found for: K, BUN, CREATININE, ALT, TSH, HGB   Lipids: No results found for: LDLCALC, LDLDIRECT, CHOL, TRIG, HDL     ASSESSMENT AND PLAN: 1.  Coronary artery disease: Currently symptomatically stable with recent hospitalization for chest pain and shortness of breath.  Currently on aspirin and lovastatin.  I will arrange for an exercise Myoview stress test.  He needs aggressive risk factor modification in the form of aggressive weight loss for morbid obesity.  2.  Hypertension: Blood pressure is elevated.  He needs aggressive weight loss.  He recently strained back muscle and is on a muscle relaxant and pain medication and says this is why his blood pressure is elevated today.  3.  Hyperlipidemia: Currently on lovastatin 40 mg.  I will request a copy of most recent lipids.  4.  Bilateral carotid artery disease: Mild disease in November 2016 bilaterally.  I will repeat Dopplers.  5.  Morbid  obesity: He needs aggressive weight loss.   Disposition: Follow up 6 months   Kate Sable, M.D., F.A.C.C.

## 2017-09-25 NOTE — Telephone Encounter (Signed)
Pre-cert Verification for the following procedure   Exercise stress myoview scheduled for 09-27-2017 at [facility]

## 2017-09-25 NOTE — Patient Instructions (Signed)
Medication Instructions:  Continue all current medications.  Labwork: none  Testing/Procedures:  Your physician has requested that you have an exercise stress myoview. For further information please visit [email or web address]. Please follow instruction sheet, as given.  Office will contact with results via phone or letter.    Follow-Up: Your physician wants you to follow up in: 6 months.  You will receive a reminder letter in the mail one-two months in advance.  If you don't receive a letter, please call our office to schedule the follow up appointment   Any Other Special Instructions Will Be Listed Below (If Applicable).  If you need a refill on your cardiac medications before your next appointment, please call your pharmacy.

## 2017-09-27 ENCOUNTER — Encounter (HOSPITAL_BASED_OUTPATIENT_CLINIC_OR_DEPARTMENT_OTHER)
Admission: RE | Admit: 2017-09-27 | Discharge: 2017-09-27 | Disposition: A | Discharge status: Home / Self Care | Payer: Medicare Other | Source: Ambulatory Visit | Attending: Cardiovascular Disease | Admitting: Cardiovascular Disease

## 2017-09-27 ENCOUNTER — Telehealth: Payer: Self-pay | Admitting: *Deleted

## 2017-09-27 ENCOUNTER — Encounter (HOSPITAL_COMMUNITY)
Admission: RE | Admit: 2017-09-27 | Discharge: 2017-09-27 | Disposition: A | Discharge status: Home / Self Care | Payer: Medicare Other | Source: Ambulatory Visit | Attending: Cardiovascular Disease | Admitting: Cardiovascular Disease

## 2017-09-27 DIAGNOSIS — R079 Chest pain, unspecified: Secondary | ICD-10-CM

## 2017-09-27 LAB — NM MYOCAR MULTI W/SPECT W/WALL MOTION / EF
CHL CUP MPHR: 156 {beats}/min
CSEPHR: 89 %
CSEPPHR: 139 {beats}/min
Estimated workload: 10.1 METS
Exercise duration (min): 8 min
Exercise duration (sec): 22 s
LHR: 0.44
LVDIAVOL: 110 mL (ref 62–150)
LVSYSVOL: 37 mL
NUC STRESS TID: 1.01
RPE: 15
Rest HR: 62 {beats}/min
SDS: 3
SRS: 4
SSS: 7

## 2017-09-27 MED ORDER — REGADENOSON 0.4 MG/5ML IV SOLN
INTRAVENOUS | Status: AC
  Filled 2017-09-27: qty 5

## 2017-09-27 MED ORDER — TECHNETIUM TC 99M TETROFOSMIN IV KIT
30.0000 | PACK | Freq: Once | INTRAVENOUS | Status: AC | PRN
  Administered 2017-09-27: 30 via INTRAVENOUS

## 2017-09-27 MED ORDER — SODIUM CHLORIDE 0.9% FLUSH
INTRAVENOUS | Status: AC
  Filled 2017-09-27: qty 10

## 2017-09-27 MED ORDER — TECHNETIUM TC 99M TETROFOSMIN IV KIT
10.0000 | PACK | Freq: Once | INTRAVENOUS | Status: AC | PRN
  Administered 2017-09-27: 10.5 via INTRAVENOUS

## 2017-09-27 NOTE — Telephone Encounter (Signed)
Notes recorded by Laurine Blazer, LPN on 06/26/8976 at 4:78 PM EDT Patient notified. Copy to pmd. ------  Notes recorded by Herminio Commons, MD on 09/27/2017 at 4:49 PM EDT Low risk for blockages.

## 2017-11-05 ENCOUNTER — Encounter: Payer: Medicare Other | Attending: Internal Medicine | Admitting: Nutrition

## 2017-11-05 DIAGNOSIS — I10 Essential (primary) hypertension: Secondary | ICD-10-CM | POA: Diagnosis not present

## 2017-11-05 DIAGNOSIS — E119 Type 2 diabetes mellitus without complications: Secondary | ICD-10-CM | POA: Diagnosis not present

## 2017-11-05 DIAGNOSIS — Z713 Dietary counseling and surveillance: Secondary | ICD-10-CM | POA: Insufficient documentation

## 2017-11-05 DIAGNOSIS — E639 Nutritional deficiency, unspecified: Secondary | ICD-10-CM | POA: Diagnosis not present

## 2017-11-05 DIAGNOSIS — E669 Obesity, unspecified: Secondary | ICD-10-CM

## 2017-11-05 NOTE — Progress Notes (Signed)
  Medical Nutrition Therapy:  Appt start time: 1100  end time: 1130 Assessment:  Primary concerns today: Diabetes Type 2-diet controlled and obesity.  A1c 5.8%July 1 , 2019. Episode in hospital  For arm pain. No stroke or HA. Admits he has hard time resisting foods in house and likes to snack, Wt up 20 lbs since June.  Needs better commitment on making better food choices an stop snacking and eatin gfor boredom. Needs to exercise. Vitals with BMI 09/25/2017  Height 6' 0"   Weight 322 lbs  BMI 05.39  Systolic 767  Diastolic 62  Pulse 64    Preferred Learning Style:   No preference indicated   Learning Readiness:  Ready  Change in progress   MEDICATIONS: see list   DIETARY INTAKE:.    24-hr recall:  B ( AM):2 boiled eggs and apple, water L)  Beans. Mixed greens, mac/cheese and corn bread, mashed potatoes. water \Dinner:supper. Snacks. Ice cream sandwich, chips.water or yogurt. Usual physical activity: ADL  Estimated energy needs: 1800 calories 200 g carbohydrates  135 g protein 50 g fat  Progress Towards Goal(s):  In progress.   Nutritional Diagnosis:  NB-1.1 Food and nutrition-related knowledge deficit As related to Prediabetes, CKD, HTN and hyperlipidemia.  As evidenced by A1C 6.2%, Urine Creatine 50 and  low HDL 27. .    Intervention:  Nutrition and Diabetes education provided on My Plate, CHO counting, meal planning, portion sizes, timing of meals, avoiding snacks between meals unless having a low blood sugar, target ranges for A1C and blood sugars, signs/symptoms and treatment of hyper/hypoglycemia, monitoring blood sugars, taking medications as prescribed, benefits of exercising 30 minutes per day and prevention of complications of DM. Low Sodium Low Fat High Fiber diet.  Goals 1. Cut our mac/cheese. Mashed potatoes, and excessive carbs 2. Cut out eating after 7 pm. 3. Increase low carb choices per meals 4. Walk 30 minutes a day 10 mins three times per day or 15  mins BID. 5. Lose 1-2 lbs per week.   Teaching Method Utilized:  Visual Auditory Hands on  Handouts given during visit include:  The Plate Method   Meal Plan Card  Diabetes Instructions.   Barriers to learning/adherence to lifestyle change: none  Demonstrated degree of understanding via:  Teach Back   Monitoring/Evaluation:  Dietary intake, exercise, meal planning, SBG, and body weight in 3-4 month(s).  Check an A1C level.

## 2017-11-05 NOTE — Patient Instructions (Signed)
Goals 1. Cut our mac/cheese. Mashed potatoes, and excessive carbs 2. Cut out eating after 7 pm. 3. Increase low carb choices per meals 4. Walk 30 minutes a day 10 mins three times per day or 15 mins BID. 5. Lose 1-2 lbs per week.

## 2017-11-06 ENCOUNTER — Encounter: Payer: Self-pay | Admitting: Nutrition

## 2017-11-22 DIAGNOSIS — I10 Essential (primary) hypertension: Secondary | ICD-10-CM | POA: Diagnosis not present

## 2017-11-22 DIAGNOSIS — Z299 Encounter for prophylactic measures, unspecified: Secondary | ICD-10-CM | POA: Diagnosis not present

## 2017-11-22 DIAGNOSIS — E1165 Type 2 diabetes mellitus with hyperglycemia: Secondary | ICD-10-CM | POA: Diagnosis not present

## 2017-11-22 DIAGNOSIS — Z23 Encounter for immunization: Secondary | ICD-10-CM | POA: Diagnosis not present

## 2017-11-22 DIAGNOSIS — M792 Neuralgia and neuritis, unspecified: Secondary | ICD-10-CM | POA: Diagnosis not present

## 2017-11-22 DIAGNOSIS — Z6841 Body Mass Index (BMI) 40.0 and over, adult: Secondary | ICD-10-CM | POA: Diagnosis not present

## 2017-12-06 DIAGNOSIS — E1165 Type 2 diabetes mellitus with hyperglycemia: Secondary | ICD-10-CM | POA: Diagnosis not present

## 2017-12-06 DIAGNOSIS — Z6841 Body Mass Index (BMI) 40.0 and over, adult: Secondary | ICD-10-CM | POA: Diagnosis not present

## 2017-12-06 DIAGNOSIS — J449 Chronic obstructive pulmonary disease, unspecified: Secondary | ICD-10-CM | POA: Diagnosis not present

## 2017-12-06 DIAGNOSIS — G4730 Sleep apnea, unspecified: Secondary | ICD-10-CM | POA: Diagnosis not present

## 2017-12-06 DIAGNOSIS — I10 Essential (primary) hypertension: Secondary | ICD-10-CM | POA: Diagnosis not present

## 2017-12-06 DIAGNOSIS — Z299 Encounter for prophylactic measures, unspecified: Secondary | ICD-10-CM | POA: Diagnosis not present

## 2017-12-17 DIAGNOSIS — L919 Hypertrophic disorder of the skin, unspecified: Secondary | ICD-10-CM | POA: Diagnosis not present

## 2017-12-17 DIAGNOSIS — L570 Actinic keratosis: Secondary | ICD-10-CM | POA: Diagnosis not present

## 2017-12-17 DIAGNOSIS — L0100 Impetigo, unspecified: Secondary | ICD-10-CM | POA: Diagnosis not present

## 2017-12-17 DIAGNOSIS — L662 Folliculitis decalvans: Secondary | ICD-10-CM | POA: Diagnosis not present

## 2017-12-17 DIAGNOSIS — D485 Neoplasm of uncertain behavior of skin: Secondary | ICD-10-CM | POA: Diagnosis not present

## 2017-12-17 DIAGNOSIS — Z85828 Personal history of other malignant neoplasm of skin: Secondary | ICD-10-CM | POA: Diagnosis not present

## 2018-01-02 DIAGNOSIS — E7800 Pure hypercholesterolemia, unspecified: Secondary | ICD-10-CM | POA: Diagnosis not present

## 2018-01-02 DIAGNOSIS — I639 Cerebral infarction, unspecified: Secondary | ICD-10-CM | POA: Diagnosis not present

## 2018-01-02 DIAGNOSIS — J449 Chronic obstructive pulmonary disease, unspecified: Secondary | ICD-10-CM | POA: Diagnosis not present

## 2018-01-02 DIAGNOSIS — I10 Essential (primary) hypertension: Secondary | ICD-10-CM | POA: Diagnosis not present

## 2018-01-11 DIAGNOSIS — E1165 Type 2 diabetes mellitus with hyperglycemia: Secondary | ICD-10-CM | POA: Diagnosis not present

## 2018-01-11 DIAGNOSIS — Z299 Encounter for prophylactic measures, unspecified: Secondary | ICD-10-CM | POA: Diagnosis not present

## 2018-01-11 DIAGNOSIS — Z6841 Body Mass Index (BMI) 40.0 and over, adult: Secondary | ICD-10-CM | POA: Diagnosis not present

## 2018-01-11 DIAGNOSIS — I10 Essential (primary) hypertension: Secondary | ICD-10-CM | POA: Diagnosis not present

## 2018-01-11 DIAGNOSIS — R110 Nausea: Secondary | ICD-10-CM | POA: Diagnosis not present

## 2018-01-18 DIAGNOSIS — Z6841 Body Mass Index (BMI) 40.0 and over, adult: Secondary | ICD-10-CM | POA: Diagnosis not present

## 2018-01-18 DIAGNOSIS — I10 Essential (primary) hypertension: Secondary | ICD-10-CM | POA: Diagnosis not present

## 2018-01-18 DIAGNOSIS — Z299 Encounter for prophylactic measures, unspecified: Secondary | ICD-10-CM | POA: Diagnosis not present

## 2018-01-18 DIAGNOSIS — R110 Nausea: Secondary | ICD-10-CM | POA: Diagnosis not present

## 2018-01-31 DIAGNOSIS — E7800 Pure hypercholesterolemia, unspecified: Secondary | ICD-10-CM | POA: Diagnosis not present

## 2018-01-31 DIAGNOSIS — J449 Chronic obstructive pulmonary disease, unspecified: Secondary | ICD-10-CM | POA: Diagnosis not present

## 2018-01-31 DIAGNOSIS — I10 Essential (primary) hypertension: Secondary | ICD-10-CM | POA: Diagnosis not present

## 2018-01-31 DIAGNOSIS — I639 Cerebral infarction, unspecified: Secondary | ICD-10-CM | POA: Diagnosis not present

## 2018-02-04 ENCOUNTER — Encounter: Payer: Medicare Other | Attending: Internal Medicine | Admitting: Nutrition

## 2018-02-04 ENCOUNTER — Encounter: Payer: Self-pay | Admitting: Nutrition

## 2018-02-04 VITALS — Ht 72.0 in | Wt 331.0 lb

## 2018-02-04 DIAGNOSIS — E1165 Type 2 diabetes mellitus with hyperglycemia: Secondary | ICD-10-CM | POA: Insufficient documentation

## 2018-02-04 DIAGNOSIS — E118 Type 2 diabetes mellitus with unspecified complications: Secondary | ICD-10-CM | POA: Diagnosis not present

## 2018-02-04 DIAGNOSIS — IMO0002 Reserved for concepts with insufficient information to code with codable children: Secondary | ICD-10-CM

## 2018-02-04 NOTE — Progress Notes (Signed)
  Medical Nutrition Therapy:  Appt start time: 1000   end time: 1030 Assessment:  Primary concerns today: Diabetes Type 2-diet controlled and obesity.  A1c 6.1% 11/2017. 30 lbs weight gain in 6 months.. Has had some knee issues.  Says he is going to change his eating habits and start losing weight. He admits he has a problem with salty and fried foods. LIkes vegetables but not eating as much as he should. Needs better commitment on making better food choices an stop snacking and eatin gfor boredom. Needs to exercise. Willing to start going to the [place] to use the treadmill and equipment.  Vitals with BMI 09/25/2017  Height 6\' 0"   Weight 322 lbs  BMI 27.51  Systolic 700  Diastolic 62  Pulse 64    Preferred Learning Style:   No preference indicated   Learning Readiness:  Ready  Change in progress   MEDICATIONS: see list   DIETARY INTAKE:.    24-hr recall:  B ( AM): 2 eggs, toast, jelly, water, or fruit Snacks: L)  Pimento cheese sandwich and chips, water   D)  Chicken grilled,  Bow tie noodles, salad-ranch dressing, , bread garlic 2 slices, water Snack  bologna, cheese,   Usual physical activity: ADL  Estimated energy needs: 1800 calories 200 g carbohydrates  135 g protein 50 g fat  Progress Towards Goal(s):  In progress.   Nutritional Diagnosis:  NB-1.1 Food and nutrition-related knowledge deficit As related to Prediabetes, CKD, HTN and hyperlipidemia.  As evidenced by A1C 6.2%, Urine Creatine 50 and  low HDL 27. .    Intervention:  Nutrition and Diabetes education provided on My Plate, CHO counting, meal planning, portion sizes, timing of meals, avoiding snacks between meals unless having a low blood sugar, target ranges for A1C and blood sugars, signs/symptoms and treatment of hyper/hypoglycemia, monitoring blood sugars, taking medications as prescribed, benefits of exercising 30 minutes per day and prevention of complications of DM. Low Sodium Low Fat High  Fiber diet.  Goals 1. Starting Jan 1st walking 30-60  minutes a day. 2, Increase fresh fruits and vegetables. 3. Drink only water 4. Lose 1-2 lbs per week.  Cut out high fat high processed foods.  Teaching Method Utilized:  Visual Auditory Hands on  Handouts given during visit include:  The Plate Method   Meal Plan Card  Diabetes Instructions.   Barriers to learning/adherence to lifestyle change: none  Demonstrated degree of understanding via:  Teach Back   Monitoring/Evaluation:  Dietary intake, exercise, meal planning, SBG, and body weight in 3-4 month(s).  Check an A1C level.

## 2018-02-04 NOTE — Patient Instructions (Addendum)
Goals 1. Starting Jan 1st walking 30-60  minutes a day. 2, Increase fresh fruits and vegetables. 3. Drink only water 4. Lose 1-2 lbs per week.  Cut out high fat high processed foods.

## 2018-02-26 DIAGNOSIS — E1165 Type 2 diabetes mellitus with hyperglycemia: Secondary | ICD-10-CM | POA: Diagnosis not present

## 2018-02-26 DIAGNOSIS — Z6841 Body Mass Index (BMI) 40.0 and over, adult: Secondary | ICD-10-CM | POA: Diagnosis not present

## 2018-02-26 DIAGNOSIS — I10 Essential (primary) hypertension: Secondary | ICD-10-CM | POA: Diagnosis not present

## 2018-02-26 DIAGNOSIS — Z87891 Personal history of nicotine dependence: Secondary | ICD-10-CM | POA: Diagnosis not present

## 2018-02-26 DIAGNOSIS — G2401 Drug induced subacute dyskinesia: Secondary | ICD-10-CM | POA: Diagnosis not present

## 2018-02-26 DIAGNOSIS — Z299 Encounter for prophylactic measures, unspecified: Secondary | ICD-10-CM | POA: Diagnosis not present

## 2018-02-26 DIAGNOSIS — J449 Chronic obstructive pulmonary disease, unspecified: Secondary | ICD-10-CM | POA: Diagnosis not present

## 2018-03-06 DIAGNOSIS — Z299 Encounter for prophylactic measures, unspecified: Secondary | ICD-10-CM | POA: Diagnosis not present

## 2018-03-06 DIAGNOSIS — J449 Chronic obstructive pulmonary disease, unspecified: Secondary | ICD-10-CM | POA: Diagnosis not present

## 2018-03-06 DIAGNOSIS — Z6841 Body Mass Index (BMI) 40.0 and over, adult: Secondary | ICD-10-CM | POA: Diagnosis not present

## 2018-03-06 DIAGNOSIS — I639 Cerebral infarction, unspecified: Secondary | ICD-10-CM | POA: Diagnosis not present

## 2018-03-06 DIAGNOSIS — I10 Essential (primary) hypertension: Secondary | ICD-10-CM | POA: Diagnosis not present

## 2018-03-06 DIAGNOSIS — E1165 Type 2 diabetes mellitus with hyperglycemia: Secondary | ICD-10-CM | POA: Diagnosis not present

## 2018-03-13 DIAGNOSIS — I639 Cerebral infarction, unspecified: Secondary | ICD-10-CM | POA: Diagnosis not present

## 2018-03-13 DIAGNOSIS — J449 Chronic obstructive pulmonary disease, unspecified: Secondary | ICD-10-CM | POA: Diagnosis not present

## 2018-03-13 DIAGNOSIS — I10 Essential (primary) hypertension: Secondary | ICD-10-CM | POA: Diagnosis not present

## 2018-03-13 DIAGNOSIS — E7800 Pure hypercholesterolemia, unspecified: Secondary | ICD-10-CM | POA: Diagnosis not present

## 2018-03-25 DIAGNOSIS — L739 Follicular disorder, unspecified: Secondary | ICD-10-CM | POA: Diagnosis not present

## 2018-03-25 DIAGNOSIS — Z85828 Personal history of other malignant neoplasm of skin: Secondary | ICD-10-CM | POA: Diagnosis not present

## 2018-03-25 DIAGNOSIS — L570 Actinic keratosis: Secondary | ICD-10-CM | POA: Diagnosis not present

## 2018-03-25 DIAGNOSIS — D485 Neoplasm of uncertain behavior of skin: Secondary | ICD-10-CM | POA: Diagnosis not present

## 2018-03-29 DIAGNOSIS — J449 Chronic obstructive pulmonary disease, unspecified: Secondary | ICD-10-CM | POA: Diagnosis not present

## 2018-03-29 DIAGNOSIS — E1165 Type 2 diabetes mellitus with hyperglycemia: Secondary | ICD-10-CM | POA: Diagnosis not present

## 2018-03-29 DIAGNOSIS — Z299 Encounter for prophylactic measures, unspecified: Secondary | ICD-10-CM | POA: Diagnosis not present

## 2018-03-29 DIAGNOSIS — I10 Essential (primary) hypertension: Secondary | ICD-10-CM | POA: Diagnosis not present

## 2018-03-29 DIAGNOSIS — Z6841 Body Mass Index (BMI) 40.0 and over, adult: Secondary | ICD-10-CM | POA: Diagnosis not present

## 2018-04-08 ENCOUNTER — Ambulatory Visit: Payer: Medicare Other | Admitting: Cardiovascular Disease

## 2018-04-10 DIAGNOSIS — I10 Essential (primary) hypertension: Secondary | ICD-10-CM | POA: Diagnosis not present

## 2018-04-10 DIAGNOSIS — I639 Cerebral infarction, unspecified: Secondary | ICD-10-CM | POA: Diagnosis not present

## 2018-04-10 DIAGNOSIS — J449 Chronic obstructive pulmonary disease, unspecified: Secondary | ICD-10-CM | POA: Diagnosis not present

## 2018-04-10 DIAGNOSIS — E7800 Pure hypercholesterolemia, unspecified: Secondary | ICD-10-CM | POA: Diagnosis not present

## 2018-04-24 ENCOUNTER — Ambulatory Visit (INDEPENDENT_AMBULATORY_CARE_PROVIDER_SITE_OTHER): Payer: Medicare Other | Admitting: Cardiovascular Disease

## 2018-04-24 ENCOUNTER — Encounter: Payer: Self-pay | Admitting: Cardiovascular Disease

## 2018-04-24 VITALS — BP 118/72 | HR 55 | Ht 72.0 in | Wt 308.0 lb

## 2018-04-24 DIAGNOSIS — I2583 Coronary atherosclerosis due to lipid rich plaque: Secondary | ICD-10-CM | POA: Diagnosis not present

## 2018-04-24 DIAGNOSIS — I2510 Atherosclerotic heart disease of native coronary artery without angina pectoris: Secondary | ICD-10-CM | POA: Diagnosis not present

## 2018-04-24 DIAGNOSIS — I739 Peripheral vascular disease, unspecified: Secondary | ICD-10-CM | POA: Diagnosis not present

## 2018-04-24 DIAGNOSIS — I10 Essential (primary) hypertension: Secondary | ICD-10-CM

## 2018-04-24 DIAGNOSIS — E785 Hyperlipidemia, unspecified: Secondary | ICD-10-CM | POA: Diagnosis not present

## 2018-04-24 DIAGNOSIS — I779 Disorder of arteries and arterioles, unspecified: Secondary | ICD-10-CM

## 2018-04-24 NOTE — Progress Notes (Signed)
SUBJECTIVE: The patient presents for routine follow-up. He has a history of mild nonobstructive coronary artery disease as per cardiac catheterization performed in 2005. He underwent a low risk nuclear stress test in 2013. He also has a history of TIA and carotid artery disease.  He underwent a normal nuclear stress test on 09/27/2017.  He is here with his wife.  As per their home scales, he has lost 14 pounds since January 2.  His wife has lost 17 pounds.  They met with a dietitian in early January and completely changed their eating habits.  [facility] also [facility].  He walks on a treadmill at 3.1 mph with a 4.5-5% incline.  He would like to get down to 270 pounds.  The patient denies any symptoms of chest pain, palpitations, shortness of breath, lightheadedness, dizziness, leg swelling, orthopnea, PND, and syncope.  He feels much more energetic.    Review of Systems: As per "subjective", otherwise negative.  No Known Allergies  Current Outpatient Medications  Medication Sig Dispense Refill  . aspirin EC 81 MG tablet Take 1 tablet (81 mg total) by mouth daily.    Marland Kitchen lisinopril (PRINIVIL,ZESTRIL) 10 MG tablet Take 10 mg by mouth daily.    Marland Kitchen lovastatin (MEVACOR) 40 MG tablet Take 80 mg by mouth at bedtime.     Marland Kitchen omeprazole (PRILOSEC) 40 MG capsule Take 1 capsule by mouth daily.    . tamsulosin (FLOMAX) 0.4 MG CAPS capsule Take 1 capsule by mouth at bedtime.     No current facility-administered medications for this visit.     Past Medical History:  Diagnosis Date  . CAD (coronary artery disease)    Mild plaque 2005  . Carotid artery disease ([place])   . Diabetes mellitus without complication ([place])   . DJD (degenerative joint disease)   . HLD (hyperlipidemia)   . Hypertension   . Syncope   . TIA (transient ischemic attack)     Past Surgical History:  Procedure Laterality Date  . CARDIAC CATHETERIZATION    . None      Social History    Socioeconomic History  . Marital status: Married    Spouse name: Not on file  . Number of children: Not on file  . Years of education: Not on file  . Highest education level: Not on file  Occupational History  . Occupation: Disabled  Social Needs  . Financial resource strain: Not on file  . Food insecurity:    Worry: Not on file    Inability: Not on file  . Transportation needs:    Medical: Not on file    Non-medical: Not on file  Tobacco Use  . Smoking status: Former Smoker    Packs/day: 2.00    Years: 37.00    Pack years: 74.00    Types: Cigarettes    Start date: 04/02/1970    Last attempt to quit: 02/21/2007    Years since quitting: 11.1  . Smokeless tobacco: Never Used  Substance and Sexual Activity  . Alcohol use: No    Alcohol/week: 0.0 standard drinks  . Drug use: No  . Sexual activity: Not on file  Lifestyle  . Physical activity:    Days per week: Not on file    Minutes per session: Not on file  . Stress: Not on file  Relationships  . Social connections:    Talks on phone: Not on file    Gets together: Not on  file    Attends religious service: Not on file    Active member of club or organization: Not on file    Attends meetings of clubs or organizations: Not on file    Relationship status: Not on file  . Intimate partner violence:    Fear of current or ex partner: Not on file    Emotionally abused: Not on file    Physically abused: Not on file    Forced sexual activity: Not on file  Other Topics Concern  . Not on file  Social History Narrative   Regular exercise: Yes     Vitals:   04/24/18 1046  BP: 118/72  Pulse: (!) 55  SpO2: 97%  Weight: (!) 308 lb (139.7 kg)  Height: 6' (1.829 m)    Wt Readings from Last 3 Encounters:  04/24/18 (!) 308 lb (139.7 kg)  02/04/18 (!) 331 lb (150.1 kg)  09/25/17 (!) 322 lb (146.1 kg)     PHYSICAL EXAM General: NAD HEENT: Normal. Neck: No JVD, no thyromegaly. Lungs: Clear to auscultation bilaterally  with normal respiratory effort. CV: Regular rate and rhythm, normal S1/S2, no S3/S4, no murmur. No pretibial or periankle edema.  No carotid bruit.   Abdomen: Firm, obese, nontender, no distention.  Neurologic: Alert and oriented.  Psych: Normal affect. Skin: Normal. Musculoskeletal: No gross deformities.    ECG: Reviewed above under Subjective   Labs: No results found for: K, BUN, CREATININE, ALT, TSH, HGB   Lipids: No results found for: LDLCALC, LDLDIRECT, CHOL, TRIG, HDL     ASSESSMENT AND PLAN:  1.  Coronary artery disease: Normal nuclear stress test on 09/27/2017.  Currently on aspirin and lovastatin.  Symptomatically stable.  2.  Hypertension: Blood pressure is normal.  No changes to therapy.   3.  Hyperlipidemia: Currently on lovastatin 40 mg.    4.  Bilateral carotid artery disease: Mild disease in November 2016 bilaterally.  I will repeat Dopplers.  5.  Morbid obesity: I congratulated him on his weight loss efforts.   Disposition: Follow up 1 year   Kate Sable, M.D., F.A.C.C.

## 2018-04-24 NOTE — Patient Instructions (Signed)
Medication Instructions:  Continue all current medications.   Follow-Up: Your physician wants you to follow up in:  1 year.  You will receive a reminder letter in the mail one-two months in advance.  If you don't receive a letter, please call our office to schedule the follow up appointment    Any Other Special Instructions Will Be Listed Below (If Applicable).  If you need a refill on your cardiac medications before your next appointment, please call your pharmacy.

## 2018-05-08 ENCOUNTER — Other Ambulatory Visit: Payer: Self-pay

## 2018-05-08 ENCOUNTER — Encounter: Payer: Medicare Other | Attending: Internal Medicine | Admitting: Nutrition

## 2018-05-08 ENCOUNTER — Encounter: Payer: Self-pay | Admitting: Nutrition

## 2018-05-08 DIAGNOSIS — E119 Type 2 diabetes mellitus without complications: Secondary | ICD-10-CM | POA: Diagnosis not present

## 2018-05-08 NOTE — Patient Instructions (Addendum)
Goals Keep up the great job!!! Keep exercising Keep eating more vegetables and fruits. Lose 3-5 lbs per month

## 2018-05-08 NOTE — Progress Notes (Signed)
  Medical Nutrition Therapy:  Appt start time: 1000   end time: 1030 Assessment:  Primary concerns today: Diabetes Type 2-diet controlled and obesity. Lost 28 lbs in the last 3 months. Has been walking 45 minutes most days. Went to see heart md recently and they told him his lungs, BP and heart/lungs levels were much better.   He feels much better. His notes his breathing is better when walking. Enjoys it. Has cut out snacks, fried foods, junk food and processed foods. Lab work will be done in the next few months.   Vitals with BMI 05/08/2018 04/24/2018 02/04/2018  Height 6\' 1"  6\' 0"  6\' 0"   Weight 302 lbs 308 lbs 331 lbs  BMI 39.85 54.62 70.35  Systolic  009   Diastolic  72   Pulse  55     Preferred Learning Style:   No preference indicated   Learning Readiness:  Ready  Change in progress   MEDICATIONS: see list   DIETARY INTAKE:.    24-hr recall:  B ( AM): 2 eggs, toast,fruit, water Snacks: L)  Salad, pork chop, apple and yogurt.water  D)  Squash, zucchini, chicken, corn, green beans, water Snack  bologna, cheese,   Usual physical activity: ADL  Estimated energy needs: 1800 calories 200 g carbohydrates  135 g protein 50 g fat  Progress Towards Goal(s):  In progress.   Nutritional Diagnosis:  NB-1.1 Food and nutrition-related knowledge deficit As related to Prediabetes, CKD, HTN and hyperlipidemia.  As evidenced by A1C 6.2%, Urine Creatine 50 and  low HDL 27. .    Intervention:  Nutrition and Diabetes education provided on My Plate, CHO counting, meal planning, portion sizes, timing of meals, avoiding snacks between meals unless having a low blood sugar, target ranges for A1C and blood sugars, signs/symptoms and treatment of hyper/hypoglycemia, monitoring blood sugars, taking medications as prescribed, benefits of exercising 30 minutes per day and prevention of complications of DM. Low Sodium Low Fat High Fiber diet.  Goals Keep up the great job!!! Keep  exercising Keep eating more vegetables and fruits. Lose 3-5 lbs per month  Teaching Method Utilized:  Visual Auditory Hands on  Handouts given during visit include:  The Plate Method   Meal Plan Card  Diabetes Instructions.   Barriers to learning/adherence to lifestyle change: none  Demonstrated degree of understanding via:  Teach Back   Monitoring/Evaluation:  Dietary intake, exercise, meal planning, SBG, and body weight in 3-4 month(s). Recommend to check an A1C level.

## 2018-05-17 DIAGNOSIS — I10 Essential (primary) hypertension: Secondary | ICD-10-CM | POA: Diagnosis not present

## 2018-05-17 DIAGNOSIS — J449 Chronic obstructive pulmonary disease, unspecified: Secondary | ICD-10-CM | POA: Diagnosis not present

## 2018-05-17 DIAGNOSIS — E7800 Pure hypercholesterolemia, unspecified: Secondary | ICD-10-CM | POA: Diagnosis not present

## 2018-05-17 DIAGNOSIS — I639 Cerebral infarction, unspecified: Secondary | ICD-10-CM | POA: Diagnosis not present

## 2018-06-14 DIAGNOSIS — J449 Chronic obstructive pulmonary disease, unspecified: Secondary | ICD-10-CM | POA: Diagnosis not present

## 2018-06-14 DIAGNOSIS — I639 Cerebral infarction, unspecified: Secondary | ICD-10-CM | POA: Diagnosis not present

## 2018-06-14 DIAGNOSIS — E7800 Pure hypercholesterolemia, unspecified: Secondary | ICD-10-CM | POA: Diagnosis not present

## 2018-06-14 DIAGNOSIS — I10 Essential (primary) hypertension: Secondary | ICD-10-CM | POA: Diagnosis not present

## 2018-07-01 DIAGNOSIS — E7800 Pure hypercholesterolemia, unspecified: Secondary | ICD-10-CM | POA: Diagnosis not present

## 2018-07-01 DIAGNOSIS — I10 Essential (primary) hypertension: Secondary | ICD-10-CM | POA: Diagnosis not present

## 2018-07-01 DIAGNOSIS — J069 Acute upper respiratory infection, unspecified: Secondary | ICD-10-CM | POA: Diagnosis not present

## 2018-07-01 DIAGNOSIS — Z299 Encounter for prophylactic measures, unspecified: Secondary | ICD-10-CM | POA: Diagnosis not present

## 2018-07-01 DIAGNOSIS — Z6841 Body Mass Index (BMI) 40.0 and over, adult: Secondary | ICD-10-CM | POA: Diagnosis not present

## 2018-07-01 DIAGNOSIS — Z87891 Personal history of nicotine dependence: Secondary | ICD-10-CM | POA: Diagnosis not present

## 2018-07-11 DIAGNOSIS — I639 Cerebral infarction, unspecified: Secondary | ICD-10-CM | POA: Diagnosis not present

## 2018-07-11 DIAGNOSIS — E7800 Pure hypercholesterolemia, unspecified: Secondary | ICD-10-CM | POA: Diagnosis not present

## 2018-07-11 DIAGNOSIS — J449 Chronic obstructive pulmonary disease, unspecified: Secondary | ICD-10-CM | POA: Diagnosis not present

## 2018-07-11 DIAGNOSIS — I10 Essential (primary) hypertension: Secondary | ICD-10-CM | POA: Diagnosis not present

## 2018-07-17 DIAGNOSIS — Z6841 Body Mass Index (BMI) 40.0 and over, adult: Secondary | ICD-10-CM | POA: Diagnosis not present

## 2018-07-17 DIAGNOSIS — Z299 Encounter for prophylactic measures, unspecified: Secondary | ICD-10-CM | POA: Diagnosis not present

## 2018-07-17 DIAGNOSIS — I10 Essential (primary) hypertension: Secondary | ICD-10-CM | POA: Diagnosis not present

## 2018-07-17 DIAGNOSIS — E1165 Type 2 diabetes mellitus with hyperglycemia: Secondary | ICD-10-CM | POA: Diagnosis not present

## 2018-07-17 DIAGNOSIS — I639 Cerebral infarction, unspecified: Secondary | ICD-10-CM | POA: Diagnosis not present

## 2018-07-17 DIAGNOSIS — J449 Chronic obstructive pulmonary disease, unspecified: Secondary | ICD-10-CM | POA: Diagnosis not present

## 2018-07-31 DIAGNOSIS — I639 Cerebral infarction, unspecified: Secondary | ICD-10-CM | POA: Diagnosis not present

## 2018-07-31 DIAGNOSIS — J449 Chronic obstructive pulmonary disease, unspecified: Secondary | ICD-10-CM | POA: Diagnosis not present

## 2018-07-31 DIAGNOSIS — E7800 Pure hypercholesterolemia, unspecified: Secondary | ICD-10-CM | POA: Diagnosis not present

## 2018-07-31 DIAGNOSIS — I10 Essential (primary) hypertension: Secondary | ICD-10-CM | POA: Diagnosis not present

## 2018-08-27 DIAGNOSIS — J449 Chronic obstructive pulmonary disease, unspecified: Secondary | ICD-10-CM | POA: Diagnosis not present

## 2018-08-27 DIAGNOSIS — E7800 Pure hypercholesterolemia, unspecified: Secondary | ICD-10-CM | POA: Diagnosis not present

## 2018-08-27 DIAGNOSIS — I10 Essential (primary) hypertension: Secondary | ICD-10-CM | POA: Diagnosis not present

## 2018-08-27 DIAGNOSIS — I639 Cerebral infarction, unspecified: Secondary | ICD-10-CM | POA: Diagnosis not present

## 2018-09-02 ENCOUNTER — Ambulatory Visit: Payer: Medicare Other | Admitting: Nutrition

## 2018-09-03 ENCOUNTER — Other Ambulatory Visit: Payer: Self-pay

## 2018-09-03 ENCOUNTER — Encounter: Payer: Medicare Other | Attending: Internal Medicine | Admitting: Nutrition

## 2018-09-03 ENCOUNTER — Encounter: Payer: Self-pay | Admitting: Nutrition

## 2018-09-03 DIAGNOSIS — E119 Type 2 diabetes mellitus without complications: Secondary | ICD-10-CM | POA: Insufficient documentation

## 2018-09-03 NOTE — Patient Instructions (Addendum)
Goals  Walk on treadmill 30 minutes twice a day Add oatmeal with breakfast Keep Drinking water a gallon of water Lose 2-3 lbs per month.

## 2018-09-03 NOTE — Progress Notes (Signed)
  Medical Nutrition Therapy:  Appt start time: 1000 end time: 1030 Assessment:  Primary concerns today: Diabetes Type 2-diet controlled and obesity. Changes made: eating vegetables, Cut out snacks and junk food. Has cut out fried foods.  Has lost about 31 lbs since December 2019. Wants to start walking on treadmill. Saw Dr. Manuella Ghazi a few weeks ago and got a good check up. Labs:  A1C  07/17/18  6.2%. A1C previously 02/2018 6.4%.  Lipids: 04/2017  TCHOL 165 mg/dl LDL 103 mg.dl HDL 33 mg/dl  Vitals with BMI 05/08/2018 04/24/2018 02/04/2018  Height 6\' 1"  6\' 0"  6\' 0"   Weight 302 lbs 308 lbs 331 lbs  BMI 39.85 09.81 19.14  Systolic  782   Diastolic  72   Pulse  55     Preferred Learning Style:   No preference indicated   Learning Readiness:  Ready  Change in progress   MEDICATIONS: see list   DIETARY INTAKE:.    24-hr recall:  B ( AM): 2 eggs, toast,fruit, water Snacks: L)  Salad, pork chop, apple and yogurt.water  D)  Squash, zucchini, chicken, corn, green beans, water Snack  bologna, cheese,   Usual physical activity: ADL  Estimated energy needs: 1800 calories 200 g carbohydrates  135 g protein 50 g fat  Progress Towards Goal(s):  In progress.   Nutritional Diagnosis:  NB-1.1 Food and nutrition-related knowledge deficit As related to Prediabetes, CKD, HTN and hyperlipidemia.  As evidenced by A1C 6.2%, Urine Creatine 50 and  low HDL 27. .    Intervention:  Nutrition and Diabetes education provided on My Plate, CHO counting, meal planning, portion sizes, timing of meals, avoiding snacks between meals unless having a low blood sugar, target ranges for A1C and blood sugars, signs/symptoms and treatment of hyper/hypoglycemia, monitoring blood sugars, taking medications as prescribed, benefits of exercising 30 minutes per day and prevention of complications of DM. Low Sodium Low Fat High Fiber diet.  Goals  Walk on treadmill 30 minutes twice a day Add oatmeal with  breakfast Keep Drinking water a gallon of water Lose 2-3 lbs per month.   Teaching Method Utilized:  Visual Auditory Hands on  Handouts given during visit include:  The Plate Method   Meal Plan Card  Diabetes Instructions.   Barriers to learning/adherence to lifestyle change: none  Demonstrated degree of understanding via:  Teach Back   Monitoring/Evaluation:  Dietary intake, exercise, meal planning, SBG, and body weight in 3-4 month(s). Recommend to check an A1C level.

## 2018-09-16 DIAGNOSIS — L570 Actinic keratosis: Secondary | ICD-10-CM | POA: Diagnosis not present

## 2018-09-19 DIAGNOSIS — M549 Dorsalgia, unspecified: Secondary | ICD-10-CM | POA: Diagnosis not present

## 2018-09-19 DIAGNOSIS — Z299 Encounter for prophylactic measures, unspecified: Secondary | ICD-10-CM | POA: Diagnosis not present

## 2018-09-19 DIAGNOSIS — Z6841 Body Mass Index (BMI) 40.0 and over, adult: Secondary | ICD-10-CM | POA: Diagnosis not present

## 2018-09-19 DIAGNOSIS — Z713 Dietary counseling and surveillance: Secondary | ICD-10-CM | POA: Diagnosis not present

## 2018-09-19 DIAGNOSIS — I10 Essential (primary) hypertension: Secondary | ICD-10-CM | POA: Diagnosis not present

## 2018-11-01 DIAGNOSIS — Z6841 Body Mass Index (BMI) 40.0 and over, adult: Secondary | ICD-10-CM | POA: Diagnosis not present

## 2018-11-01 DIAGNOSIS — J019 Acute sinusitis, unspecified: Secondary | ICD-10-CM | POA: Diagnosis not present

## 2018-11-01 DIAGNOSIS — J449 Chronic obstructive pulmonary disease, unspecified: Secondary | ICD-10-CM | POA: Diagnosis not present

## 2018-11-01 DIAGNOSIS — Z299 Encounter for prophylactic measures, unspecified: Secondary | ICD-10-CM | POA: Diagnosis not present

## 2018-11-01 DIAGNOSIS — K219 Gastro-esophageal reflux disease without esophagitis: Secondary | ICD-10-CM | POA: Diagnosis not present

## 2018-11-01 DIAGNOSIS — E1165 Type 2 diabetes mellitus with hyperglycemia: Secondary | ICD-10-CM | POA: Diagnosis not present

## 2018-11-20 DIAGNOSIS — Z23 Encounter for immunization: Secondary | ICD-10-CM | POA: Diagnosis not present

## 2018-11-28 DIAGNOSIS — I10 Essential (primary) hypertension: Secondary | ICD-10-CM | POA: Diagnosis not present

## 2018-11-28 DIAGNOSIS — Z299 Encounter for prophylactic measures, unspecified: Secondary | ICD-10-CM | POA: Diagnosis not present

## 2018-11-28 DIAGNOSIS — J019 Acute sinusitis, unspecified: Secondary | ICD-10-CM | POA: Diagnosis not present

## 2018-11-28 DIAGNOSIS — Z6841 Body Mass Index (BMI) 40.0 and over, adult: Secondary | ICD-10-CM | POA: Diagnosis not present

## 2018-11-28 DIAGNOSIS — Z713 Dietary counseling and surveillance: Secondary | ICD-10-CM | POA: Diagnosis not present

## 2018-12-15 DIAGNOSIS — Z79899 Other long term (current) drug therapy: Secondary | ICD-10-CM | POA: Diagnosis not present

## 2018-12-15 DIAGNOSIS — K219 Gastro-esophageal reflux disease without esophagitis: Secondary | ICD-10-CM | POA: Diagnosis not present

## 2018-12-15 DIAGNOSIS — Z87891 Personal history of nicotine dependence: Secondary | ICD-10-CM | POA: Diagnosis not present

## 2018-12-15 DIAGNOSIS — M7989 Other specified soft tissue disorders: Secondary | ICD-10-CM | POA: Diagnosis not present

## 2018-12-15 DIAGNOSIS — M79661 Pain in right lower leg: Secondary | ICD-10-CM | POA: Diagnosis not present

## 2018-12-15 DIAGNOSIS — S86811D Strain of other muscle(s) and tendon(s) at lower leg level, right leg, subsequent encounter: Secondary | ICD-10-CM | POA: Diagnosis not present

## 2018-12-15 DIAGNOSIS — I10 Essential (primary) hypertension: Secondary | ICD-10-CM | POA: Diagnosis not present

## 2018-12-15 DIAGNOSIS — M1711 Unilateral primary osteoarthritis, right knee: Secondary | ICD-10-CM | POA: Diagnosis not present

## 2018-12-15 DIAGNOSIS — W501XXA Accidental kick by another person, initial encounter: Secondary | ICD-10-CM | POA: Diagnosis not present

## 2018-12-15 DIAGNOSIS — S86911S Strain of unspecified muscle(s) and tendon(s) at lower leg level, right leg, sequela: Secondary | ICD-10-CM | POA: Diagnosis not present

## 2018-12-15 DIAGNOSIS — E7800 Pure hypercholesterolemia, unspecified: Secondary | ICD-10-CM | POA: Diagnosis not present

## 2018-12-15 DIAGNOSIS — Z7982 Long term (current) use of aspirin: Secondary | ICD-10-CM | POA: Diagnosis not present

## 2018-12-15 DIAGNOSIS — M79671 Pain in right foot: Secondary | ICD-10-CM | POA: Diagnosis not present

## 2018-12-16 DIAGNOSIS — M79661 Pain in right lower leg: Secondary | ICD-10-CM | POA: Diagnosis not present

## 2018-12-16 DIAGNOSIS — M1711 Unilateral primary osteoarthritis, right knee: Secondary | ICD-10-CM | POA: Diagnosis not present

## 2018-12-16 DIAGNOSIS — M7989 Other specified soft tissue disorders: Secondary | ICD-10-CM | POA: Diagnosis not present

## 2019-01-08 ENCOUNTER — Encounter: Payer: Medicare Other | Attending: Internal Medicine | Admitting: Nutrition

## 2019-01-08 ENCOUNTER — Other Ambulatory Visit: Payer: Self-pay

## 2019-01-08 ENCOUNTER — Encounter: Payer: Self-pay | Admitting: Nutrition

## 2019-01-08 DIAGNOSIS — R739 Hyperglycemia, unspecified: Secondary | ICD-10-CM | POA: Diagnosis not present

## 2019-01-08 DIAGNOSIS — E782 Mixed hyperlipidemia: Secondary | ICD-10-CM | POA: Diagnosis not present

## 2019-01-08 DIAGNOSIS — E119 Type 2 diabetes mellitus without complications: Secondary | ICD-10-CM | POA: Diagnosis not present

## 2019-01-08 NOTE — Patient Instructions (Signed)
Goals  Get back to eating meals on time Cut out snacks Watch portions Increase lower carb vegetables. Drink only water and cut out sodas Lose 1- 2 lbs per week Increase high fiber foods. Don't eat past 7 pm.

## 2019-01-08 NOTE — Progress Notes (Signed)
  Medical Nutrition Therapy:  Appt start time: 1000 end time: 1030 Assessment:  Primary concerns today: Diabetes Type 2-diet controlled and obesity. Here with his wife. He has gained 30 lbs since July. He and his wife report their daughter and family were living with them while building a house. Diet has changed.  He is willing to get back to eating better balanced meals and exercise. He is walking 30 minutes most days of the week. dmits to not being able to resist his favorite foods and portion control.   Saw Dr. Manuella Ghazi a few weeks ago and got a good check up. Labs:  A1C  07/17/18  6.2%. A1C previously 02/2018 6.4%.  Lipids: 04/2017  TCHOL 165 mg/dl LDL 103 mg.dl HDL 33 mg/dl  Vitals with BMI 05/08/2018 04/24/2018 02/04/2018  Height 6\' 1"  6\' 0"  6\' 0"   Weight 302 lbs 308 lbs 331 lbs  BMI 39.85 [id number]  Systolic  123456   Diastolic  72   Pulse  55     Preferred Learning Style:   No preference indicated   Learning Readiness:  Ready  Change in progress   MEDICATIONS: see list   DIETARY INTAKE:.    24-hr recall:  B ( AM): 2 eggs, toast,fruit, water Snacks: L)  Salad, pork chop, apple and yogurt.water  D)  Squash, zucchini, chicken, corn, green beans, water Snack  bologna, cheese,   Usual physical activity: ADL  Estimated energy needs: 1800 calories 200 g carbohydrates  135 g protein 50 g fat  Progress Towards Goal(s):  In progress.   Nutritional Diagnosis:  NB-1.1 Food and nutrition-related knowledge deficit As related to Prediabetes, CKD, HTN and hyperlipidemia.  As evidenced by A1C 6.2%, Urine Creatine 50 and  low HDL 27. .    Intervention:  Nutrition and Diabetes education provided on My Plate, CHO counting, meal planning, portion sizes, timing of meals, avoiding snacks between meals unless having a low blood sugar, target ranges for A1C and blood sugars, signs/symptoms and treatment of hyper/hypoglycemia, monitoring blood sugars, taking medications as prescribed,  benefits of exercising 30 minutes per day and prevention of complications of DM. Low Sodium Low Fat High Fiber diet.  Goals  Get back to eating meals on time Cut out snacks Watch portions Increase lower carb vegetables. Drink only water and cut out sodas Lose 1- 2 lbs per week Increase high fiber foods. Don't eat past 7 pm.  Teaching Method Utilized:  Visual Auditory Hands on  Handouts given during visit include:  The Plate Method   Meal Plan Card  Diabetes Instructions.   Barriers to learning/adherence to lifestyle change: none  Demonstrated degree of understanding via:  Teach Back   Monitoring/Evaluation:  Dietary intake, exercise, meal planning, SBG, and body weight in 3-4 month(s). Recommend to check an A1C level.

## 2019-03-04 DIAGNOSIS — J449 Chronic obstructive pulmonary disease, unspecified: Secondary | ICD-10-CM | POA: Diagnosis not present

## 2019-03-04 DIAGNOSIS — I639 Cerebral infarction, unspecified: Secondary | ICD-10-CM | POA: Diagnosis not present

## 2019-03-04 DIAGNOSIS — E7800 Pure hypercholesterolemia, unspecified: Secondary | ICD-10-CM | POA: Diagnosis not present

## 2019-03-04 DIAGNOSIS — I10 Essential (primary) hypertension: Secondary | ICD-10-CM | POA: Diagnosis not present

## 2019-03-27 DIAGNOSIS — Z299 Encounter for prophylactic measures, unspecified: Secondary | ICD-10-CM | POA: Diagnosis not present

## 2019-03-27 DIAGNOSIS — Z6841 Body Mass Index (BMI) 40.0 and over, adult: Secondary | ICD-10-CM | POA: Diagnosis not present

## 2019-03-27 DIAGNOSIS — Z713 Dietary counseling and surveillance: Secondary | ICD-10-CM | POA: Diagnosis not present

## 2019-03-27 DIAGNOSIS — Z20822 Contact with and (suspected) exposure to covid-19: Secondary | ICD-10-CM | POA: Diagnosis not present

## 2019-03-27 DIAGNOSIS — Z87891 Personal history of nicotine dependence: Secondary | ICD-10-CM | POA: Diagnosis not present

## 2019-03-28 ENCOUNTER — Other Ambulatory Visit: Payer: Self-pay

## 2019-03-28 ENCOUNTER — Ambulatory Visit: Payer: Medicare Other | Attending: Internal Medicine

## 2019-03-28 DIAGNOSIS — Z20822 Contact with and (suspected) exposure to covid-19: Secondary | ICD-10-CM

## 2019-03-30 ENCOUNTER — Telehealth: Payer: Self-pay

## 2019-03-30 LAB — NOVEL CORONAVIRUS, NAA: SARS-CoV-2, NAA: NOT DETECTED

## 2019-03-30 NOTE — Telephone Encounter (Signed)

## 2019-04-02 DIAGNOSIS — J449 Chronic obstructive pulmonary disease, unspecified: Secondary | ICD-10-CM | POA: Diagnosis not present

## 2019-04-02 DIAGNOSIS — I10 Essential (primary) hypertension: Secondary | ICD-10-CM | POA: Diagnosis not present

## 2019-04-02 DIAGNOSIS — E7800 Pure hypercholesterolemia, unspecified: Secondary | ICD-10-CM | POA: Diagnosis not present

## 2019-04-02 DIAGNOSIS — I639 Cerebral infarction, unspecified: Secondary | ICD-10-CM | POA: Diagnosis not present

## 2019-04-11 DIAGNOSIS — Z6841 Body Mass Index (BMI) 40.0 and over, adult: Secondary | ICD-10-CM | POA: Diagnosis not present

## 2019-04-11 DIAGNOSIS — G4730 Sleep apnea, unspecified: Secondary | ICD-10-CM | POA: Diagnosis not present

## 2019-04-11 DIAGNOSIS — Z299 Encounter for prophylactic measures, unspecified: Secondary | ICD-10-CM | POA: Diagnosis not present

## 2019-04-11 DIAGNOSIS — I10 Essential (primary) hypertension: Secondary | ICD-10-CM | POA: Diagnosis not present

## 2019-04-11 DIAGNOSIS — Z713 Dietary counseling and surveillance: Secondary | ICD-10-CM | POA: Diagnosis not present

## 2019-04-15 DIAGNOSIS — Z85828 Personal history of other malignant neoplasm of skin: Secondary | ICD-10-CM | POA: Diagnosis not present

## 2019-04-15 DIAGNOSIS — L739 Follicular disorder, unspecified: Secondary | ICD-10-CM | POA: Diagnosis not present

## 2019-04-25 DIAGNOSIS — J449 Chronic obstructive pulmonary disease, unspecified: Secondary | ICD-10-CM | POA: Diagnosis not present

## 2019-04-25 DIAGNOSIS — Z299 Encounter for prophylactic measures, unspecified: Secondary | ICD-10-CM | POA: Diagnosis not present

## 2019-04-25 DIAGNOSIS — Z6841 Body Mass Index (BMI) 40.0 and over, adult: Secondary | ICD-10-CM | POA: Diagnosis not present

## 2019-04-25 DIAGNOSIS — I10 Essential (primary) hypertension: Secondary | ICD-10-CM | POA: Diagnosis not present

## 2019-04-25 DIAGNOSIS — E1165 Type 2 diabetes mellitus with hyperglycemia: Secondary | ICD-10-CM | POA: Diagnosis not present

## 2019-04-25 DIAGNOSIS — E119 Type 2 diabetes mellitus without complications: Secondary | ICD-10-CM | POA: Diagnosis not present

## 2019-05-06 DIAGNOSIS — J449 Chronic obstructive pulmonary disease, unspecified: Secondary | ICD-10-CM | POA: Diagnosis not present

## 2019-05-06 DIAGNOSIS — I639 Cerebral infarction, unspecified: Secondary | ICD-10-CM | POA: Diagnosis not present

## 2019-05-06 DIAGNOSIS — E7800 Pure hypercholesterolemia, unspecified: Secondary | ICD-10-CM | POA: Diagnosis not present

## 2019-05-06 DIAGNOSIS — I10 Essential (primary) hypertension: Secondary | ICD-10-CM | POA: Diagnosis not present

## 2019-05-12 ENCOUNTER — Ambulatory Visit: Payer: Medicare Other | Admitting: Nutrition

## 2019-05-20 DIAGNOSIS — E1165 Type 2 diabetes mellitus with hyperglycemia: Secondary | ICD-10-CM | POA: Diagnosis not present

## 2019-05-22 DIAGNOSIS — E7800 Pure hypercholesterolemia, unspecified: Secondary | ICD-10-CM | POA: Diagnosis not present

## 2019-05-22 DIAGNOSIS — Z1211 Encounter for screening for malignant neoplasm of colon: Secondary | ICD-10-CM | POA: Diagnosis not present

## 2019-05-22 DIAGNOSIS — Z125 Encounter for screening for malignant neoplasm of prostate: Secondary | ICD-10-CM | POA: Diagnosis not present

## 2019-05-22 DIAGNOSIS — Z1331 Encounter for screening for depression: Secondary | ICD-10-CM | POA: Diagnosis not present

## 2019-05-22 DIAGNOSIS — I10 Essential (primary) hypertension: Secondary | ICD-10-CM | POA: Diagnosis not present

## 2019-05-22 DIAGNOSIS — Z1339 Encounter for screening examination for other mental health and behavioral disorders: Secondary | ICD-10-CM | POA: Diagnosis not present

## 2019-05-22 DIAGNOSIS — Z7189 Other specified counseling: Secondary | ICD-10-CM | POA: Diagnosis not present

## 2019-05-22 DIAGNOSIS — Z6841 Body Mass Index (BMI) 40.0 and over, adult: Secondary | ICD-10-CM | POA: Diagnosis not present

## 2019-05-22 DIAGNOSIS — Z299 Encounter for prophylactic measures, unspecified: Secondary | ICD-10-CM | POA: Diagnosis not present

## 2019-05-22 DIAGNOSIS — Z79899 Other long term (current) drug therapy: Secondary | ICD-10-CM | POA: Diagnosis not present

## 2019-05-22 DIAGNOSIS — Z0000 Encounter for general adult medical examination without abnormal findings: Secondary | ICD-10-CM | POA: Diagnosis not present

## 2019-05-22 DIAGNOSIS — E1165 Type 2 diabetes mellitus with hyperglycemia: Secondary | ICD-10-CM | POA: Diagnosis not present

## 2019-05-22 DIAGNOSIS — G4730 Sleep apnea, unspecified: Secondary | ICD-10-CM | POA: Diagnosis not present

## 2019-05-26 DIAGNOSIS — J441 Chronic obstructive pulmonary disease with (acute) exacerbation: Secondary | ICD-10-CM | POA: Diagnosis not present

## 2019-05-26 DIAGNOSIS — I10 Essential (primary) hypertension: Secondary | ICD-10-CM | POA: Diagnosis not present

## 2019-05-26 DIAGNOSIS — Z87891 Personal history of nicotine dependence: Secondary | ICD-10-CM | POA: Diagnosis not present

## 2019-05-26 DIAGNOSIS — J449 Chronic obstructive pulmonary disease, unspecified: Secondary | ICD-10-CM | POA: Diagnosis not present

## 2019-05-26 DIAGNOSIS — Z299 Encounter for prophylactic measures, unspecified: Secondary | ICD-10-CM | POA: Diagnosis not present

## 2019-06-05 DIAGNOSIS — Z299 Encounter for prophylactic measures, unspecified: Secondary | ICD-10-CM | POA: Diagnosis not present

## 2019-06-05 DIAGNOSIS — Z6841 Body Mass Index (BMI) 40.0 and over, adult: Secondary | ICD-10-CM | POA: Diagnosis not present

## 2019-06-05 DIAGNOSIS — I10 Essential (primary) hypertension: Secondary | ICD-10-CM | POA: Diagnosis not present

## 2019-06-05 DIAGNOSIS — E1165 Type 2 diabetes mellitus with hyperglycemia: Secondary | ICD-10-CM | POA: Diagnosis not present

## 2019-06-05 DIAGNOSIS — J449 Chronic obstructive pulmonary disease, unspecified: Secondary | ICD-10-CM | POA: Diagnosis not present

## 2019-06-10 DIAGNOSIS — I10 Essential (primary) hypertension: Secondary | ICD-10-CM | POA: Diagnosis not present

## 2019-06-10 DIAGNOSIS — J449 Chronic obstructive pulmonary disease, unspecified: Secondary | ICD-10-CM | POA: Diagnosis not present

## 2019-06-10 DIAGNOSIS — E7800 Pure hypercholesterolemia, unspecified: Secondary | ICD-10-CM | POA: Diagnosis not present

## 2019-06-10 DIAGNOSIS — I639 Cerebral infarction, unspecified: Secondary | ICD-10-CM | POA: Diagnosis not present

## 2019-06-18 DIAGNOSIS — Z23 Encounter for immunization: Secondary | ICD-10-CM | POA: Diagnosis not present

## 2019-06-20 DIAGNOSIS — E1165 Type 2 diabetes mellitus with hyperglycemia: Secondary | ICD-10-CM | POA: Diagnosis not present

## 2019-07-01 ENCOUNTER — Other Ambulatory Visit: Payer: Self-pay

## 2019-07-01 ENCOUNTER — Encounter: Payer: Self-pay | Admitting: Nutrition

## 2019-07-01 ENCOUNTER — Encounter: Payer: Medicare Other | Attending: Internal Medicine | Admitting: Nutrition

## 2019-07-01 DIAGNOSIS — I2510 Atherosclerotic heart disease of native coronary artery without angina pectoris: Secondary | ICD-10-CM | POA: Diagnosis not present

## 2019-07-01 DIAGNOSIS — E1165 Type 2 diabetes mellitus with hyperglycemia: Secondary | ICD-10-CM | POA: Diagnosis not present

## 2019-07-01 DIAGNOSIS — E118 Type 2 diabetes mellitus with unspecified complications: Secondary | ICD-10-CM | POA: Insufficient documentation

## 2019-07-01 DIAGNOSIS — IMO0002 Reserved for concepts with insufficient information to code with codable children: Secondary | ICD-10-CM

## 2019-07-01 DIAGNOSIS — I10 Essential (primary) hypertension: Secondary | ICD-10-CM

## 2019-07-01 DIAGNOSIS — E782 Mixed hyperlipidemia: Secondary | ICD-10-CM | POA: Diagnosis not present

## 2019-07-01 NOTE — Progress Notes (Signed)
  Medical Nutrition Therapy:  Appt start time: 0930  end time:1000  Assessment:  Primary concerns today: Diabetes Type 2-diet controlled and obesity.    Went to hospital for elevated BP and fluid in [facility] 2021. Here with his wife  Blood sugars are 99-110 mg/dl. Drinking only water now and unsweet tea. Has cut out cookies and desserts. A1C was 11% Previously. He will get his labs rechecked at next MD appt.  Not taking any DM meds at this point.  Saw Dr. Manuella Ghazi a few weeks ago and got a good check up. Labs:  A1C  07/17/18  6.2%. A1C previously 02/2018 6.4%.  Lipids: 04/2017  TCHOL 165 mg/dl LDL 103 mg.dl HDL 33 mg/dl  Vitals with BMI 05/08/2018 04/24/2018 02/04/2018  Height 6\' 1"  6\' 0"  6\' 0"   Weight 302 lbs 308 lbs 331 lbs  BMI 39.85 [id number]  Systolic  123456   Diastolic  72   Pulse  55     Preferred Learning Style:   No preference indicated   Learning Readiness:  Ready  Change in progress   MEDICATIONS: see list   DIETARY INTAKE:.    24-hr recall:  B ( AM): Eggs and Bran cereal,  Snacks: L)  Salad with chicken,  Water.  D)  Hamburger with bun and hot dog with bun, water,  Usual physical activity: ADL  Estimated energy needs: 1800 calories 200 g carbohydrates  135 g protein 50 g fat  Progress Towards Goal(s):  In progress.   Nutritional Diagnosis:  NB-1.1 Food and nutrition-related knowledge deficit As related to Prediabetes, CKD, HTN and hyperlipidemia.  As evidenced by A1C 6.2%, Urine Creatine 50 and  low HDL 27. .    Intervention:  Nutrition and Diabetes education provided on My Plate, CHO counting, meal planning, portion sizes, timing of meals, avoiding snacks between meals unless having a low blood sugar, target ranges for A1C and blood sugars, signs/symptoms and treatment of hyper/hypoglycemia, monitoring blood sugars, taking medications as prescribed, benefits of exercising 30 minutes per day and prevention of complications of DM. Low Sodium Low Fat  High Fiber diet.  Goals  Get back to eating meals on time Cut out snacks Watch portions Increase lower carb vegetables. Drink only water and cut out sodas Lose 1- 2 lbs per week Increase high fiber foods. Don't eat past 7 pm.  Teaching Method Utilized:  Visual Auditory Hands on  Handouts given during visit include:  The Plate Method   Meal Plan Card  Diabetes Instructions.   Barriers to learning/adherence to lifestyle change: none  Demonstrated degree of understanding via:  Teach Back   Monitoring/Evaluation:  Dietary intake, exercise, meal planning, SBG, and body weight in 3-4 month(s). Recommend to check an A1C level.

## 2019-07-07 DIAGNOSIS — Z299 Encounter for prophylactic measures, unspecified: Secondary | ICD-10-CM | POA: Diagnosis not present

## 2019-07-07 DIAGNOSIS — N390 Urinary tract infection, site not specified: Secondary | ICD-10-CM | POA: Diagnosis not present

## 2019-07-07 DIAGNOSIS — I639 Cerebral infarction, unspecified: Secondary | ICD-10-CM | POA: Diagnosis not present

## 2019-07-07 DIAGNOSIS — J449 Chronic obstructive pulmonary disease, unspecified: Secondary | ICD-10-CM | POA: Diagnosis not present

## 2019-07-07 DIAGNOSIS — E1165 Type 2 diabetes mellitus with hyperglycemia: Secondary | ICD-10-CM | POA: Diagnosis not present

## 2019-07-10 ENCOUNTER — Encounter: Payer: Self-pay | Admitting: Nutrition

## 2019-07-10 NOTE — Patient Instructions (Signed)
Goals  Follow My Plate Measure foods out. 50% of plate should be low carb vegetables. Do not eat between meals Drink  Only water Avoid processed and salty foods. Lose 1-2 lbs per week.

## 2019-07-16 DIAGNOSIS — Z23 Encounter for immunization: Secondary | ICD-10-CM | POA: Diagnosis not present

## 2019-07-20 DIAGNOSIS — J449 Chronic obstructive pulmonary disease, unspecified: Secondary | ICD-10-CM | POA: Diagnosis not present

## 2019-07-20 DIAGNOSIS — E1165 Type 2 diabetes mellitus with hyperglycemia: Secondary | ICD-10-CM | POA: Diagnosis not present

## 2019-07-20 DIAGNOSIS — I639 Cerebral infarction, unspecified: Secondary | ICD-10-CM | POA: Diagnosis not present

## 2019-07-20 DIAGNOSIS — E7800 Pure hypercholesterolemia, unspecified: Secondary | ICD-10-CM | POA: Diagnosis not present

## 2019-07-20 DIAGNOSIS — I10 Essential (primary) hypertension: Secondary | ICD-10-CM | POA: Diagnosis not present

## 2019-08-20 DIAGNOSIS — I10 Essential (primary) hypertension: Secondary | ICD-10-CM | POA: Diagnosis not present

## 2019-08-20 DIAGNOSIS — I639 Cerebral infarction, unspecified: Secondary | ICD-10-CM | POA: Diagnosis not present

## 2019-08-20 DIAGNOSIS — J449 Chronic obstructive pulmonary disease, unspecified: Secondary | ICD-10-CM | POA: Diagnosis not present

## 2019-08-20 DIAGNOSIS — E1165 Type 2 diabetes mellitus with hyperglycemia: Secondary | ICD-10-CM | POA: Diagnosis not present

## 2019-08-20 DIAGNOSIS — E7800 Pure hypercholesterolemia, unspecified: Secondary | ICD-10-CM | POA: Diagnosis not present

## 2019-09-04 ENCOUNTER — Encounter: Payer: Medicare Other | Attending: Internal Medicine | Admitting: Nutrition

## 2019-09-04 ENCOUNTER — Other Ambulatory Visit: Payer: Self-pay

## 2019-09-04 ENCOUNTER — Encounter: Payer: Self-pay | Admitting: Nutrition

## 2019-09-04 DIAGNOSIS — E1165 Type 2 diabetes mellitus with hyperglycemia: Secondary | ICD-10-CM | POA: Insufficient documentation

## 2019-09-04 DIAGNOSIS — E782 Mixed hyperlipidemia: Secondary | ICD-10-CM | POA: Diagnosis not present

## 2019-09-04 DIAGNOSIS — E118 Type 2 diabetes mellitus with unspecified complications: Secondary | ICD-10-CM | POA: Diagnosis not present

## 2019-09-04 DIAGNOSIS — I10 Essential (primary) hypertension: Secondary | ICD-10-CM | POA: Insufficient documentation

## 2019-09-04 DIAGNOSIS — IMO0002 Reserved for concepts with insufficient information to code with codable children: Secondary | ICD-10-CM

## 2019-09-04 NOTE — Progress Notes (Signed)
  Medical Nutrition Therapy:  Appt start time: 1100  end time:1130 Assessment:  Primary concerns today: Diabetes Type 2-diet controlled and obesity.   Here with his wife. A1C 6.5% down from 11% with diet changes only. Working in garden and mowing yard for exercise.  Has cut out processed foods. Eating meals on time. Avoiding snacks 90% of the time. PCP Dr. Manuella Ghazi. Wants to lose weight now that he has his blood sugar back in line. Goal weight 250 lbs. Will start walking on treadmill.  BP has improved since making food choice changes. Weight still needs to come down 10% in the next 3 months.     Vitals with BMI 05/08/2018 04/24/2018 02/04/2018  Height 6\' 1"  6\' 0"  6\' 0"   Weight 302 lbs 308 lbs 331 lbs  BMI 39.85 59.16 38.46  Systolic  659   Diastolic  72   Pulse  55     Preferred Learning Style:   No preference indicated   Learning Readiness:  Ready  Change in progress   MEDICATIONS: see list   DIETARY INTAKE:.    24-hr recall:  B ( AM): Eggs and Bran cereal,  Snacks: L)  Salad with chicken,  Water.  D)  Hamburger with bun and hot dog with bun, water,  Usual physical activity: ADL  Estimated energy needs: 1800 calories 200 g carbohydrates  135 g protein 50 g fat  Progress Towards Goal(s):  In progress.   Nutritional Diagnosis:  NB-1.1 Food and nutrition-related knowledge deficit As related to Prediabetes, CKD, HTN and hyperlipidemia.  As evidenced by A1C 6.2%, Urine Creatine 50 and  low HDL 27. .    Intervention:  Nutrition and Diabetes education provided on My Plate, CHO counting, meal planning, portion sizes, timing of meals, avoiding snacks between meals unless having a low blood sugar, target ranges for A1C and blood sugars, signs/symptoms and treatment of hyper/hypoglycemia, monitoring blood sugars, taking medications as prescribed, benefits of exercising 30 minutes per day and prevention of complications of DM. Low Sodium Low Fat High Fiber diet.  Goals  Walk 15  minutes a day. Watch portions sizes Lose 1-2 lbs per week. Keep BS less than 120 mg/dl.   Teaching Method Utilized:  Visual Auditory Hands on  Handouts given during visit include:  The Plate Method   Meal Plan Card  Diabetes Instructions.   Barriers to learning/adherence to lifestyle change: none  Demonstrated degree of understanding via:  Teach Back   Monitoring/Evaluation:  Dietary intake, exercise, meal planning, SBG, and body weight in 3-4 month(s).

## 2019-09-04 NOTE — Patient Instructions (Signed)
Goals  Walk 15 minutes a day. Watch portions sizes Lose 1-2 lbs per week. Keep BS less than 120 mg/dl.

## 2019-09-04 NOTE — Progress Notes (Signed)
09/04/2019  Referring Provider: Dr. Manuella Ghazi  Patient Name: Logan Hunt DOB: 1955-03-23  Date of Visit: 09-04-19  Reason for visit: DM Type 2, Morbid Obesity, HTN, Hyperlipidemia,  Thank you for [facility].  The information provided summarizes the assessment and the patient's goals, which were established at this visit.  Medical Nutrition Therapy:  Appt start time: 1100  end time:1130 Assessment:  Primary concerns today: Diabetes Type 2-diet controlled and obesity.   Here with his wife. A1C 6.5% down from 11% with diet changes only. Working in garden and mowing yard for exercise.  Has cut out processed foods. Eating meals on time. Avoiding snacks 90% of the time. PCP Dr. Manuella Ghazi. Wants to lose weight now that he has his blood sugar back in line. Goal weight 250 lbs. Will start walking on treadmill.  BP has improved since making food choice changes. Weight still needs to come down 10% in the next 3 months.     Vitals with BMI 05/08/2018 04/24/2018 02/04/2018  Height 6\' 1"  6\' 0"  6\' 0"   Weight 302 lbs 308 lbs 331 lbs  BMI 39.85 27.74 12.87  Systolic  867   Diastolic  72   Pulse  55     Preferred Learning Style:   No preference indicated   Learning Readiness:  Ready  Change in progress   MEDICATIONS: see list   DIETARY INTAKE:.    24-hr recall:  B ( AM): Eggs and Bran cereal,  Snacks: L)  Salad with chicken,  Water.  D)  Hamburger with bun and hot dog with bun, water,  Usual physical activity: ADL  Estimated energy needs: 1800 calories 200 g carbohydrates  135 g protein 50 g fat  Progress Towards Goal(s):  In progress.   Nutritional Diagnosis:  NB-1.1 Food and nutrition-related knowledge deficit As related to Prediabetes, CKD, HTN and hyperlipidemia.  As evidenced by A1C 6.2%, Urine Creatine 50 and  low HDL 27. .    Intervention:  Nutrition and Diabetes education provided on My Plate, CHO counting, meal  planning, portion sizes, timing of meals, avoiding snacks between meals unless having a low blood sugar, target ranges for A1C and blood sugars, signs/symptoms and treatment of hyper/hypoglycemia, monitoring blood sugars, taking medications as prescribed, benefits of exercising 30 minutes per day and prevention of complications of DM. Low Sodium Low Fat High Fiber diet.  Goals  Walk 15 minutes a day. Watch portions sizes Lose 1-2 lbs per week. Keep BS less than 120 mg/dl.   Teaching Method Utilized:  Visual Auditory Hands on  Handouts given during visit include:  The Plate Method   Meal Plan Card  Diabetes Instructions.   Barriers to learning/adherence to lifestyle change: none  Demonstrated degree of understanding via:  Teach Back   Monitoring/Evaluation:  Dietary intake, exercise, meal planning, SBG, and body weight in 3-4 month(s).   If you have any question regarding your patient's care, do not hesitate to call me at [phone]   Sincerely,   Jearld Fenton, RDN, CDE

## 2019-09-07 NOTE — Progress Notes (Addendum)
Cardiology Office Note  Date: 09/08/2019   ID: Courtney, Fenlon 05-19-1955, MRN [id number]  PCP:  Monico Blitz, MD  Cardiologist:  Kate Sable, MD Electrophysiologist:  None   Chief Complaint: Follow-up CAD  History of Present Illness: Logan Hunt is a 64 y.o. male with a history of CAD, HTN, bilateral carotid artery disease, HLD, morbid obesity, DM.  Last seen by Dr. Bronson Ing 04/24/2018 for follow-up secondary to history of mild nonobstructive CAD per cardiac catheterization 2005.  Low risk nuclear stress test 2013.  History of TIA and carotid artery disease.  Nuclear stress test normal 09/27/2017.  The last visit he had lost 14 pounds since January 2020 and had been eating better.  [facility]  He stated he would like to get down to 270 pounds.  He denied any symptoms of chest pain, palpitations, shortness of breath, lightheadedness, dizziness, leg swelling, orthopnea, PND or syncope.  He continued his aspirin and lovastatin.  His CAD was symptomatically stable.  Blood pressure was normal.    He is here for 1 year follow-up.  He denies any recent acute illnesses, hospitalizations since last follow-up.  No complaints of progressive anginal or exertional symptoms other than some mild exertional dyspnea when working in his garden.  Denies any palpitations or arrhythmias, CVA or TIA-like symptoms, PND or orthopnea, bleeding issues, claudication-like symptoms, lower extremity edema, or DVT/PE-like symptoms.  Past Medical History:  Diagnosis Date  . CAD (coronary artery disease)    Mild plaque 2005  . Carotid artery disease ([place])   . Diabetes mellitus without complication ([place])   . DJD (degenerative joint disease)   . HLD (hyperlipidemia)   . Hypertension   . Syncope   . TIA (transient ischemic attack)     Past Surgical History:  Procedure Laterality Date  . CARDIAC CATHETERIZATION    . None      Current Outpatient Medications    Medication Sig Dispense Refill  . albuterol (VENTOLIN HFA) 108 (90 Base) MCG/ACT inhaler Inhale into the lungs every 6 (six) hours as needed for wheezing or shortness of breath.    Marland Kitchen aspirin EC 81 MG tablet Take 1 tablet (81 mg total) by mouth daily.    Marland Kitchen doxycycline (DORYX) 100 MG EC tablet Take 100 mg by mouth daily.    Marland Kitchen lisinopril (ZESTRIL) 20 MG tablet Take 20 mg by mouth daily.    Marland Kitchen lovastatin (MEVACOR) 40 MG tablet Take 80 mg by mouth at bedtime.     Marland Kitchen omeprazole (PRILOSEC) 40 MG capsule Take 1 capsule by mouth daily.     No current facility-administered medications for this visit.   Allergies:  Patient has no known allergies.   Social History: The patient  reports that he quit smoking about 12 years ago. His smoking use included cigarettes. He started smoking about 49 years ago. He has a 74.00 pack-year smoking history. He has never used smokeless tobacco. He reports that he does not drink alcohol and does not use drugs.   Family History: The patient's family history includes Breast cancer in his mother; Diabetes in his father; Heart disease in his father; Lung cancer in his mother.   ROS:  Please see the history of present illness. Otherwise, complete review of systems is positive for none.  All other systems are reviewed and negative.   Physical Exam: VS:  BP 140/62   Pulse 60   Ht 6' (1.829 m)   Annell Greening Marland Kitchen)  317 lb (143.8 kg)   SpO2 98%   BMI 42.99 kg/m , BMI Body mass index is 42.99 kg/m.  Wt Readings from Last 3 Encounters:  09/08/19 (!) 317 lb (143.8 kg)  09/04/19 (!) 316 lb (143.3 kg)  07/01/19 (!) 317 lb 6.4 oz (144 kg)    General: Obese patient appears comfortable at rest. Neck: Supple, no elevated JVP or carotid bruits, no thyromegaly. Lungs: Clear to auscultation, nonlabored breathing at rest. Cardiac: Regular rate and rhythm, no S3 or significant systolic murmur, no pericardial rub. Extremities: No pitting edema, distal pulses 2+. Skin: Warm and  dry. Musculoskeletal: No kyphosis. Neuropsychiatric: Alert and oriented x3, affect grossly appropriate.  ECG:  An ECG dated 09/08/2019 was personally reviewed today and demonstrated:  Sinus rhythm with sinus arrhythmia with first-degree AV block rate of 62  Recent Labwork: No results found for requested labs within last 8760 hours.  No results found for: CHOL, TRIG, HDL, CHOLHDL, VLDL, LDLCALC, LDLDIRECT  Other Studies Reviewed Today:  NST 09/27/2017 Study Result  Narrative & Impression   Clinically and electrcially negative for ischemia  Probable normal perfusion and soft tissue attenuation (diaphragm, bowel activity)  Nuclear stress EF: 66%.  Low risk study       Assessment and Plan:  1. CAD in native artery   2. Essential hypertension   3. Mixed hyperlipidemia   4. Bilateral carotid artery stenosis   5. Morbid obesity ([place])    1. CAD in native artery Denies any progressive anginal or exertional symptoms other than mild dyspnea when working in his garden.  Continue aspirin 81 mg.  2. Essential hypertension Blood pressure 140/62.  Patient states his primary care physician increased his lisinopril to 40 mg recently.  States home blood pressures are usually low 502D up to 741 systolic.  Continue lisinopril 40 mg p.o. daily.  3. Mixed hyperlipidemia Continue lovastatin 40 mg p.o. daily.  Recent cholesterol labs drawn on 06/10/2019 at PCP office showed total cholesterol 131, triglycerides 299, HDL 27, VLDL 47, LDL 57.  4. Bilateral carotid artery stenosis Stable heterogeneous plaque, bilaterally. Stable 1-39% bilateral ICA stenosis on carotid artery duplex 2016.  No neuro symptoms.  5. Morbid obesity ([place]) Today's weight 317.  BMI of 42.99.  Patient is eating better and attempting to exercise on a home treadmill in order to lose more weight.  Medication Adjustments/Labs and Tests Ordered: Current medicines are reviewed at length with the patient today.  Concerns regarding  medicines are outlined above.   Disposition: Follow-up with Dr. Domenic Polite or APP 1 year.  Signed, Logan July, NP 09/08/2019 9:01 AM    [facility] [place], [place], [place] [address] Phone: [phone]; Fax: [phone]

## 2019-09-08 ENCOUNTER — Ambulatory Visit (INDEPENDENT_AMBULATORY_CARE_PROVIDER_SITE_OTHER): Payer: Medicare Other | Admitting: Family Medicine

## 2019-09-08 ENCOUNTER — Encounter: Payer: Self-pay | Admitting: Family Medicine

## 2019-09-08 VITALS — BP 140/62 | HR 60 | Ht 72.0 in | Wt 317.0 lb

## 2019-09-08 DIAGNOSIS — I10 Essential (primary) hypertension: Secondary | ICD-10-CM | POA: Diagnosis not present

## 2019-09-08 DIAGNOSIS — I2510 Atherosclerotic heart disease of native coronary artery without angina pectoris: Secondary | ICD-10-CM

## 2019-09-08 DIAGNOSIS — E782 Mixed hyperlipidemia: Secondary | ICD-10-CM

## 2019-09-08 DIAGNOSIS — I6523 Occlusion and stenosis of bilateral carotid arteries: Secondary | ICD-10-CM | POA: Diagnosis not present

## 2019-09-08 NOTE — Patient Instructions (Signed)
Medication Instructions:  Your physician recommends that you continue on your current medications as directed. Please refer to the Current Medication list given to you today.  *If you need a refill on your cardiac medications before your next appointment, please call your pharmacy*   Lab Work: None ordered  If you have labs (blood work) drawn today and your tests are completely normal, you will receive your results only by: Marland Kitchen MyChart Message (if you have MyChart) OR . A paper copy in the mail If you have any lab test that is abnormal or we need to change your treatment, we will call you to review the results.   Testing/Procedures: None ordered   Follow-Up: At [facility], you and your health needs are our priority.  As part of our continuing mission to provide you with exceptional heart care, we have created designated Provider Care Teams.  These Care Teams include your primary Cardiologist (physician) and Advanced Practice Providers (APPs -  Physician Assistants and Nurse Practitioners) who all work together to provide you with the care you need, when you need it.  We recommend signing up for the patient portal called "MyChart".  Sign up information is provided on this After Visit Summary.  MyChart is used to connect with patients for Virtual Visits (Telemedicine).  Patients are able to view lab/test results, encounter notes, upcoming appointments, etc.  Non-urgent messages can be sent to your provider as well.   To learn more about what you can do with MyChart, go to [email or web address].    Your next appointment:   12 month(s)  The format for your next appointment:   In Person  Provider:   You may see Dr. Harl Bowie or the following Advanced Practice Provider on your designated Care Team:    Katina Dung, NP    Other Instructions

## 2019-09-08 NOTE — Addendum Note (Signed)
Addended by: Gaetano Net on: 09/08/2019 09:33 AM   Modules accepted: Orders

## 2019-09-19 DIAGNOSIS — E7800 Pure hypercholesterolemia, unspecified: Secondary | ICD-10-CM | POA: Diagnosis not present

## 2019-09-19 DIAGNOSIS — I639 Cerebral infarction, unspecified: Secondary | ICD-10-CM | POA: Diagnosis not present

## 2019-09-19 DIAGNOSIS — I10 Essential (primary) hypertension: Secondary | ICD-10-CM | POA: Diagnosis not present

## 2019-09-19 DIAGNOSIS — E1165 Type 2 diabetes mellitus with hyperglycemia: Secondary | ICD-10-CM | POA: Diagnosis not present

## 2019-09-19 DIAGNOSIS — J449 Chronic obstructive pulmonary disease, unspecified: Secondary | ICD-10-CM | POA: Diagnosis not present

## 2019-10-02 DIAGNOSIS — I639 Cerebral infarction, unspecified: Secondary | ICD-10-CM | POA: Diagnosis not present

## 2019-10-02 DIAGNOSIS — J449 Chronic obstructive pulmonary disease, unspecified: Secondary | ICD-10-CM | POA: Diagnosis not present

## 2019-10-02 DIAGNOSIS — I10 Essential (primary) hypertension: Secondary | ICD-10-CM | POA: Diagnosis not present

## 2019-10-02 DIAGNOSIS — E7800 Pure hypercholesterolemia, unspecified: Secondary | ICD-10-CM | POA: Diagnosis not present

## 2019-10-07 DIAGNOSIS — L570 Actinic keratosis: Secondary | ICD-10-CM | POA: Diagnosis not present

## 2019-10-07 DIAGNOSIS — L821 Other seborrheic keratosis: Secondary | ICD-10-CM | POA: Diagnosis not present

## 2019-10-07 DIAGNOSIS — Z85828 Personal history of other malignant neoplasm of skin: Secondary | ICD-10-CM | POA: Diagnosis not present

## 2019-10-20 DIAGNOSIS — S39012A Strain of muscle, fascia and tendon of lower back, initial encounter: Secondary | ICD-10-CM | POA: Diagnosis not present

## 2019-10-20 DIAGNOSIS — E1165 Type 2 diabetes mellitus with hyperglycemia: Secondary | ICD-10-CM | POA: Diagnosis not present

## 2019-10-20 DIAGNOSIS — E1129 Type 2 diabetes mellitus with other diabetic kidney complication: Secondary | ICD-10-CM | POA: Diagnosis not present

## 2019-10-20 DIAGNOSIS — I10 Essential (primary) hypertension: Secondary | ICD-10-CM | POA: Diagnosis not present

## 2019-10-20 DIAGNOSIS — Z299 Encounter for prophylactic measures, unspecified: Secondary | ICD-10-CM | POA: Diagnosis not present

## 2019-10-21 DIAGNOSIS — E1165 Type 2 diabetes mellitus with hyperglycemia: Secondary | ICD-10-CM | POA: Diagnosis not present

## 2019-11-20 DIAGNOSIS — E7800 Pure hypercholesterolemia, unspecified: Secondary | ICD-10-CM | POA: Diagnosis not present

## 2019-11-20 DIAGNOSIS — E1165 Type 2 diabetes mellitus with hyperglycemia: Secondary | ICD-10-CM | POA: Diagnosis not present

## 2019-11-20 DIAGNOSIS — J449 Chronic obstructive pulmonary disease, unspecified: Secondary | ICD-10-CM | POA: Diagnosis not present

## 2019-11-20 DIAGNOSIS — I10 Essential (primary) hypertension: Secondary | ICD-10-CM | POA: Diagnosis not present

## 2019-11-20 DIAGNOSIS — I639 Cerebral infarction, unspecified: Secondary | ICD-10-CM | POA: Diagnosis not present

## 2019-11-26 DIAGNOSIS — Z23 Encounter for immunization: Secondary | ICD-10-CM | POA: Diagnosis not present

## 2019-11-28 DIAGNOSIS — Z6841 Body Mass Index (BMI) 40.0 and over, adult: Secondary | ICD-10-CM | POA: Diagnosis not present

## 2019-11-28 DIAGNOSIS — Z87891 Personal history of nicotine dependence: Secondary | ICD-10-CM | POA: Diagnosis not present

## 2019-11-28 DIAGNOSIS — J019 Acute sinusitis, unspecified: Secondary | ICD-10-CM | POA: Diagnosis not present

## 2019-11-28 DIAGNOSIS — Z299 Encounter for prophylactic measures, unspecified: Secondary | ICD-10-CM | POA: Diagnosis not present

## 2019-11-28 DIAGNOSIS — J449 Chronic obstructive pulmonary disease, unspecified: Secondary | ICD-10-CM | POA: Diagnosis not present

## 2019-11-28 DIAGNOSIS — E1165 Type 2 diabetes mellitus with hyperglycemia: Secondary | ICD-10-CM | POA: Diagnosis not present

## 2019-12-01 IMAGING — NM NM MYOCAR MULTI W/SPECT W/WALL MOTION & EF
2 series · 12 of 12 positions shown · non-contrast
Comparison: none

[Series 1: rest · 6.51mm/px · 6 of 64 frames shown]
[frame 6/64]
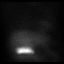
[frame 16/64]
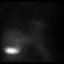
[frame 27/64]
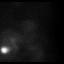
[frame 38/64]
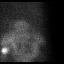
[frame 48/64]
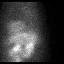
[frame 59/64]
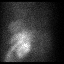

[Series 3: stress gated - perfusion · 6.51mm/px · 6 of 64 frames shown]
[frame 6/64]
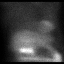
[frame 16/64]
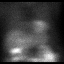
[frame 27/64]
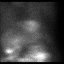
[frame 38/64]
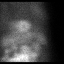
[frame 48/64]
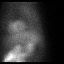
[frame 59/64]
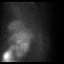

[12 of 12 positions shown; findings below may reference images not displayed]

Canned report from images found in remote index.

Refer to host system for actual result text.

## 2019-12-04 DIAGNOSIS — Z299 Encounter for prophylactic measures, unspecified: Secondary | ICD-10-CM | POA: Diagnosis not present

## 2019-12-04 DIAGNOSIS — J449 Chronic obstructive pulmonary disease, unspecified: Secondary | ICD-10-CM | POA: Diagnosis not present

## 2019-12-04 DIAGNOSIS — I10 Essential (primary) hypertension: Secondary | ICD-10-CM | POA: Diagnosis not present

## 2019-12-04 DIAGNOSIS — E1165 Type 2 diabetes mellitus with hyperglycemia: Secondary | ICD-10-CM | POA: Diagnosis not present

## 2019-12-04 DIAGNOSIS — Z6841 Body Mass Index (BMI) 40.0 and over, adult: Secondary | ICD-10-CM | POA: Diagnosis not present

## 2019-12-08 ENCOUNTER — Encounter: Payer: Medicare Other | Attending: Internal Medicine | Admitting: Nutrition

## 2019-12-08 ENCOUNTER — Encounter: Payer: Self-pay | Admitting: Nutrition

## 2019-12-08 ENCOUNTER — Other Ambulatory Visit: Payer: Self-pay

## 2019-12-08 DIAGNOSIS — I6529 Occlusion and stenosis of unspecified carotid artery: Secondary | ICD-10-CM

## 2019-12-08 DIAGNOSIS — IMO0002 Reserved for concepts with insufficient information to code with codable children: Secondary | ICD-10-CM

## 2019-12-08 DIAGNOSIS — E118 Type 2 diabetes mellitus with unspecified complications: Secondary | ICD-10-CM | POA: Insufficient documentation

## 2019-12-08 DIAGNOSIS — E1165 Type 2 diabetes mellitus with hyperglycemia: Secondary | ICD-10-CM | POA: Insufficient documentation

## 2019-12-08 DIAGNOSIS — I10 Essential (primary) hypertension: Secondary | ICD-10-CM

## 2019-12-08 DIAGNOSIS — E782 Mixed hyperlipidemia: Secondary | ICD-10-CM | POA: Insufficient documentation

## 2019-12-08 NOTE — Progress Notes (Signed)
Phone visit  Medical Nutrition Therapy:  Appt start time: 0932  end time:1500  Assessment:  Primary concerns today: Diabetes Type 2-diet controlled and obesity.  A1C 6.3%  FBS 80-120's Still not on any medications for DM and trying to control if with diet and exercise. Changed: cut down on portions, eating more lower carb vegetables. Drinking a lot of water Exercise: walking and yard work and gardening.   Vitals with BMI 05/08/2018 04/24/2018 02/04/2018  Height 6\' 1"  6\' 0"  6\' 0"   Weight 302 lbs 308 lbs 331 lbs  BMI 39.85 35.57 32.20  Systolic  254   Diastolic  72   Pulse  55     Preferred Learning Style:   No preference indicated   Learning Readiness:  Ready  Change in progress   MEDICATIONS: see list   DIETARY INTAKE:.    24-hr recall:  B ( AM): Eggs and Bran cereal,  Snacks: L)  Salad with chicken,  Water.  D)  Meat and vegetables, water Usual physical activity:  walking  Estimated energy needs: 1800 calories 200 g carbohydrates  135 g protein 50 g fat  Progress Towards Goal(s):  In progress.   Nutritional Diagnosis:  NB-1.1 Food and nutrition-related knowledge deficit As related to Prediabetes, CKD, HTN and hyperlipidemia.  As evidenced by A1C 6.2%, Urine Creatine 50 and  low HDL 27. .    Intervention:  Nutrition and Diabetes education provided on My Plate, CHO counting, meal planning, portion sizes, timing of meals, avoiding snacks between meals unless having a low blood sugar, target ranges for A1C and blood sugars, signs/symptoms and treatment of hyper/hypoglycemia, monitoring blood sugars, taking medications as prescribed, benefits of exercising 30 minutes per day and prevention of complications of DM. Low Sodium Low Fat High Fiber diet. Weight loss tips.   Goals  Keep up the great job! Increase walking as tolerated to 30-45 minutes 3-4 times per week Lose 1 lb per week Continue with your current meal plan Keep morning blood sugars less than 130  mg/dl and bedtime less than 150 mg/dl. Weight loss will be the biggest help in controlling your diabetes. .   Teaching Method Utilized:  Visual Auditory Hands on  Handouts given during visit include:  The Plate Method   Meal Plan Card  Diabetes Instructions.   Barriers to learning/adherence to lifestyle change: none  Demonstrated degree of understanding via:  Teach Back   Monitoring/Evaluation:  Dietary intake, exercise, meal planning, SBG, and body weight in 3-4 month(s).

## 2019-12-09 ENCOUNTER — Encounter: Payer: Self-pay | Admitting: Nutrition

## 2019-12-09 NOTE — Patient Instructions (Signed)
Goals  Keep up the great job! Increase walking as tolerated to 30-45 minutes 3-4 times per week Lose 1 lb per week Continue with your current meal plan Keep morning blood sugars less than 130 mg/dl and bedtime less than 150 mg/dl. Weight loss will be the biggest help in controlling your diabetes.  

## 2019-12-18 DIAGNOSIS — J019 Acute sinusitis, unspecified: Secondary | ICD-10-CM | POA: Diagnosis not present

## 2019-12-18 DIAGNOSIS — J449 Chronic obstructive pulmonary disease, unspecified: Secondary | ICD-10-CM | POA: Diagnosis not present

## 2019-12-18 DIAGNOSIS — Z299 Encounter for prophylactic measures, unspecified: Secondary | ICD-10-CM | POA: Diagnosis not present

## 2019-12-19 DIAGNOSIS — I10 Essential (primary) hypertension: Secondary | ICD-10-CM | POA: Diagnosis not present

## 2019-12-19 DIAGNOSIS — J449 Chronic obstructive pulmonary disease, unspecified: Secondary | ICD-10-CM | POA: Diagnosis not present

## 2019-12-19 DIAGNOSIS — I639 Cerebral infarction, unspecified: Secondary | ICD-10-CM | POA: Diagnosis not present

## 2019-12-19 DIAGNOSIS — E7800 Pure hypercholesterolemia, unspecified: Secondary | ICD-10-CM | POA: Diagnosis not present

## 2019-12-20 DIAGNOSIS — E1165 Type 2 diabetes mellitus with hyperglycemia: Secondary | ICD-10-CM | POA: Diagnosis not present

## 2020-01-20 DIAGNOSIS — E7800 Pure hypercholesterolemia, unspecified: Secondary | ICD-10-CM | POA: Diagnosis not present

## 2020-01-20 DIAGNOSIS — I10 Essential (primary) hypertension: Secondary | ICD-10-CM | POA: Diagnosis not present

## 2020-01-20 DIAGNOSIS — E1165 Type 2 diabetes mellitus with hyperglycemia: Secondary | ICD-10-CM | POA: Diagnosis not present

## 2020-01-20 DIAGNOSIS — J449 Chronic obstructive pulmonary disease, unspecified: Secondary | ICD-10-CM | POA: Diagnosis not present

## 2020-01-20 DIAGNOSIS — I639 Cerebral infarction, unspecified: Secondary | ICD-10-CM | POA: Diagnosis not present

## 2020-01-28 DIAGNOSIS — Z23 Encounter for immunization: Secondary | ICD-10-CM | POA: Diagnosis not present

## 2020-02-05 DIAGNOSIS — K429 Umbilical hernia without obstruction or gangrene: Secondary | ICD-10-CM | POA: Diagnosis not present

## 2020-02-05 DIAGNOSIS — I10 Essential (primary) hypertension: Secondary | ICD-10-CM | POA: Diagnosis not present

## 2020-02-05 DIAGNOSIS — K219 Gastro-esophageal reflux disease without esophagitis: Secondary | ICD-10-CM | POA: Diagnosis not present

## 2020-02-05 DIAGNOSIS — Z299 Encounter for prophylactic measures, unspecified: Secondary | ICD-10-CM | POA: Diagnosis not present

## 2020-02-05 DIAGNOSIS — R10819 Abdominal tenderness, unspecified site: Secondary | ICD-10-CM | POA: Diagnosis not present

## 2020-02-11 DIAGNOSIS — Z789 Other specified health status: Secondary | ICD-10-CM | POA: Diagnosis not present

## 2020-02-11 DIAGNOSIS — J019 Acute sinusitis, unspecified: Secondary | ICD-10-CM | POA: Diagnosis not present

## 2020-02-11 DIAGNOSIS — J449 Chronic obstructive pulmonary disease, unspecified: Secondary | ICD-10-CM | POA: Diagnosis not present

## 2020-02-11 DIAGNOSIS — I10 Essential (primary) hypertension: Secondary | ICD-10-CM | POA: Diagnosis not present

## 2020-02-11 DIAGNOSIS — Z299 Encounter for prophylactic measures, unspecified: Secondary | ICD-10-CM | POA: Diagnosis not present

## 2020-02-13 DIAGNOSIS — J029 Acute pharyngitis, unspecified: Secondary | ICD-10-CM | POA: Diagnosis not present

## 2020-02-16 DIAGNOSIS — K760 Fatty (change of) liver, not elsewhere classified: Secondary | ICD-10-CM | POA: Diagnosis not present

## 2020-02-19 DIAGNOSIS — E7800 Pure hypercholesterolemia, unspecified: Secondary | ICD-10-CM | POA: Diagnosis not present

## 2020-02-19 DIAGNOSIS — I639 Cerebral infarction, unspecified: Secondary | ICD-10-CM | POA: Diagnosis not present

## 2020-02-19 DIAGNOSIS — I10 Essential (primary) hypertension: Secondary | ICD-10-CM | POA: Diagnosis not present

## 2020-02-19 DIAGNOSIS — J449 Chronic obstructive pulmonary disease, unspecified: Secondary | ICD-10-CM | POA: Diagnosis not present

## 2020-04-05 DIAGNOSIS — L663 Perifolliculitis capitis abscedens: Secondary | ICD-10-CM | POA: Diagnosis not present

## 2020-04-05 DIAGNOSIS — Z85828 Personal history of other malignant neoplasm of skin: Secondary | ICD-10-CM | POA: Diagnosis not present

## 2020-04-05 DIAGNOSIS — L814 Other melanin hyperpigmentation: Secondary | ICD-10-CM | POA: Diagnosis not present

## 2020-04-05 DIAGNOSIS — L570 Actinic keratosis: Secondary | ICD-10-CM | POA: Diagnosis not present

## 2020-05-10 ENCOUNTER — Other Ambulatory Visit: Payer: Self-pay

## 2020-05-10 ENCOUNTER — Encounter: Payer: Medicare Other | Attending: Internal Medicine | Admitting: Nutrition

## 2020-05-10 ENCOUNTER — Encounter: Payer: Self-pay | Admitting: Nutrition

## 2020-05-10 DIAGNOSIS — I10 Essential (primary) hypertension: Secondary | ICD-10-CM | POA: Diagnosis not present

## 2020-05-10 DIAGNOSIS — E782 Mixed hyperlipidemia: Secondary | ICD-10-CM | POA: Diagnosis not present

## 2020-05-10 DIAGNOSIS — E1165 Type 2 diabetes mellitus with hyperglycemia: Secondary | ICD-10-CM | POA: Diagnosis not present

## 2020-05-10 DIAGNOSIS — IMO0002 Reserved for concepts with insufficient information to code with codable children: Secondary | ICD-10-CM

## 2020-05-10 DIAGNOSIS — E118 Type 2 diabetes mellitus with unspecified complications: Secondary | ICD-10-CM | POA: Diagnosis not present

## 2020-05-10 NOTE — Patient Instructions (Signed)
Goals  Keep up the great job! Increase walking as tolerated to 30-45 minutes 3-4 times per week Lose 1 lb per week Continue with your current meal plan Keep morning blood sugars less than 130 mg/dl and bedtime less than 150 mg/dl. Weight loss will be the biggest help in controlling your diabetes.

## 2020-05-10 NOTE — Progress Notes (Signed)
  Medical Nutrition Therapy:  Appt start time: 6045  end time:1515  Assessment:  Primary concerns today: Diabetes Type 2-diet controlled and obesity.  A1C 6.1.%  FBS 100-120'S.  He is concerned about both his hands trembling and he can't hold items or open things. He can't hold his utensils very well due to his hands shaking. Recommended him to talk to his PCP and get referral to address his issues.  Still not on any medications for DM and trying to control if with diet and exercise. Changed: cut down on portions, eating more lower carb vegetables. A1C doing well at 6.1%. Gained almost 30 lbs. He notes he is eating more but has gotten back on track and working losing weight. Drinking a lot of water Exercise: walking and yard work and gardening.   Vitals with BMI 05/08/2018 04/24/2018 02/04/2018  Height 6\' 1"  6\' 0"  6\' 0"   Weight 302 lbs 308 lbs 331 lbs  BMI 39.85 40.98 11.91  Systolic  478   Diastolic  72   Pulse  55     Preferred Learning Style:   No preference indicated   Learning Readiness:  Ready  Change in progress   MEDICATIONS: see list   DIETARY INTAKE:.    24-hr recall:  B ( AM): Eggs and Bran cereal,  Snacks: L)  Salad with chicken,  Water.  D)  Meat and vegetables, water Usual physical activity:  walking  Estimated energy needs: 1800 calories 200 g carbohydrates  135 g protein 50 g fat  Progress Towards Goal(s):  In progress.   Nutritional Diagnosis:  NB-1.1 Food and nutrition-related knowledge deficit As related to Prediabetes, CKD, HTN and hyperlipidemia.  As evidenced by A1C 6.2%, Urine Creatine 50 and  low HDL 27. .    Intervention:  Need for calorie reduction, reduced portion sizes and increased activity for needed weight loss. Reviewed benefits of exercising 30 minutes per day and prevention of complications of DM. Low Sodium Low Fat High Fiber diet. Weight loss tips.   Goals  Keep up the great job! Increase walking as tolerated to 30-45  minutes 3-4 times per week Lose 1 lb per week--need to work on weight loss. Continue with your current meal plan-but watch portions and eat more lower carb vegetables. Keep morning blood sugars less than 130 mg/dl and bedtime less than 150 mg/dl. Weight loss will be the biggest help in controlling your diabetes.  .   Teaching Method Utilized:  Visual Auditory Hands on  Handouts given during visit include:  The Plate Method   Meal Plan Card  Diabetes Instructions.   Barriers to learning/adherence to lifestyle change: none  Demonstrated degree of understanding via:  Teach Back   Monitoring/Evaluation:  Dietary intake, exercise, meal planning, SBG, and body weight in 3-4 month(s).  >>>Needs to be referred to someone to  Evaluate his hand trembles as it's effecting his ADL.

## 2020-05-11 ENCOUNTER — Encounter: Payer: Self-pay | Admitting: Nutrition

## 2020-05-27 DIAGNOSIS — E7800 Pure hypercholesterolemia, unspecified: Secondary | ICD-10-CM | POA: Diagnosis not present

## 2020-05-27 DIAGNOSIS — Z125 Encounter for screening for malignant neoplasm of prostate: Secondary | ICD-10-CM | POA: Diagnosis not present

## 2020-05-27 DIAGNOSIS — Z1339 Encounter for screening examination for other mental health and behavioral disorders: Secondary | ICD-10-CM | POA: Diagnosis not present

## 2020-05-27 DIAGNOSIS — Z6841 Body Mass Index (BMI) 40.0 and over, adult: Secondary | ICD-10-CM | POA: Diagnosis not present

## 2020-05-27 DIAGNOSIS — Z299 Encounter for prophylactic measures, unspecified: Secondary | ICD-10-CM | POA: Diagnosis not present

## 2020-05-27 DIAGNOSIS — Z1331 Encounter for screening for depression: Secondary | ICD-10-CM | POA: Diagnosis not present

## 2020-05-27 DIAGNOSIS — Z87891 Personal history of nicotine dependence: Secondary | ICD-10-CM | POA: Diagnosis not present

## 2020-05-27 DIAGNOSIS — I10 Essential (primary) hypertension: Secondary | ICD-10-CM | POA: Diagnosis not present

## 2020-05-27 DIAGNOSIS — Z7189 Other specified counseling: Secondary | ICD-10-CM | POA: Diagnosis not present

## 2020-05-27 DIAGNOSIS — E1165 Type 2 diabetes mellitus with hyperglycemia: Secondary | ICD-10-CM | POA: Diagnosis not present

## 2020-05-27 DIAGNOSIS — Z79899 Other long term (current) drug therapy: Secondary | ICD-10-CM | POA: Diagnosis not present

## 2020-05-27 DIAGNOSIS — Z0000 Encounter for general adult medical examination without abnormal findings: Secondary | ICD-10-CM | POA: Diagnosis not present

## 2020-05-27 DIAGNOSIS — R5383 Other fatigue: Secondary | ICD-10-CM | POA: Diagnosis not present

## 2020-06-07 DIAGNOSIS — J449 Chronic obstructive pulmonary disease, unspecified: Secondary | ICD-10-CM | POA: Diagnosis not present

## 2020-06-07 DIAGNOSIS — J019 Acute sinusitis, unspecified: Secondary | ICD-10-CM | POA: Diagnosis not present

## 2020-06-07 DIAGNOSIS — E1165 Type 2 diabetes mellitus with hyperglycemia: Secondary | ICD-10-CM | POA: Diagnosis not present

## 2020-06-07 DIAGNOSIS — I10 Essential (primary) hypertension: Secondary | ICD-10-CM | POA: Diagnosis not present

## 2020-06-07 DIAGNOSIS — Z299 Encounter for prophylactic measures, unspecified: Secondary | ICD-10-CM | POA: Diagnosis not present

## 2020-07-27 DIAGNOSIS — J309 Allergic rhinitis, unspecified: Secondary | ICD-10-CM | POA: Diagnosis not present

## 2020-07-27 DIAGNOSIS — I10 Essential (primary) hypertension: Secondary | ICD-10-CM | POA: Diagnosis not present

## 2020-07-27 DIAGNOSIS — Z6841 Body Mass Index (BMI) 40.0 and over, adult: Secondary | ICD-10-CM | POA: Diagnosis not present

## 2020-07-27 DIAGNOSIS — K219 Gastro-esophageal reflux disease without esophagitis: Secondary | ICD-10-CM | POA: Diagnosis not present

## 2020-07-27 DIAGNOSIS — Z87891 Personal history of nicotine dependence: Secondary | ICD-10-CM | POA: Diagnosis not present

## 2020-07-27 DIAGNOSIS — E7800 Pure hypercholesterolemia, unspecified: Secondary | ICD-10-CM | POA: Diagnosis not present

## 2020-07-27 DIAGNOSIS — Z299 Encounter for prophylactic measures, unspecified: Secondary | ICD-10-CM | POA: Diagnosis not present

## 2020-08-30 DIAGNOSIS — R809 Proteinuria, unspecified: Secondary | ICD-10-CM | POA: Diagnosis not present

## 2020-08-30 DIAGNOSIS — Z6841 Body Mass Index (BMI) 40.0 and over, adult: Secondary | ICD-10-CM | POA: Diagnosis not present

## 2020-08-30 DIAGNOSIS — I10 Essential (primary) hypertension: Secondary | ICD-10-CM | POA: Diagnosis not present

## 2020-08-30 DIAGNOSIS — E1129 Type 2 diabetes mellitus with other diabetic kidney complication: Secondary | ICD-10-CM | POA: Diagnosis not present

## 2020-08-30 DIAGNOSIS — E1165 Type 2 diabetes mellitus with hyperglycemia: Secondary | ICD-10-CM | POA: Diagnosis not present

## 2020-08-30 DIAGNOSIS — Z299 Encounter for prophylactic measures, unspecified: Secondary | ICD-10-CM | POA: Diagnosis not present

## 2020-09-08 ENCOUNTER — Ambulatory Visit: Payer: Medicare Other | Admitting: Nutrition

## 2020-09-08 ENCOUNTER — Telehealth: Payer: Self-pay | Admitting: Nutrition

## 2020-09-08 NOTE — Telephone Encounter (Signed)
Talked to patient's wife. Need to reschedule appt until we get updated referral from PCP. Requested she contact Dr. Trena Platt office and have them send updated referral form to [facility] office in [place]. She verbalized understanding.

## 2020-09-23 ENCOUNTER — Other Ambulatory Visit: Payer: Self-pay

## 2020-09-23 ENCOUNTER — Encounter: Payer: Medicare Other | Attending: Internal Medicine | Admitting: Nutrition

## 2020-09-23 ENCOUNTER — Encounter: Payer: Self-pay | Admitting: Nutrition

## 2020-09-23 DIAGNOSIS — I2510 Atherosclerotic heart disease of native coronary artery without angina pectoris: Secondary | ICD-10-CM | POA: Insufficient documentation

## 2020-09-23 DIAGNOSIS — E118 Type 2 diabetes mellitus with unspecified complications: Secondary | ICD-10-CM | POA: Diagnosis not present

## 2020-09-23 DIAGNOSIS — E1165 Type 2 diabetes mellitus with hyperglycemia: Secondary | ICD-10-CM | POA: Insufficient documentation

## 2020-09-23 DIAGNOSIS — I10 Essential (primary) hypertension: Secondary | ICD-10-CM | POA: Insufficient documentation

## 2020-09-23 DIAGNOSIS — E782 Mixed hyperlipidemia: Secondary | ICD-10-CM | POA: Insufficient documentation

## 2020-09-23 DIAGNOSIS — IMO0002 Reserved for concepts with insufficient information to code with codable children: Secondary | ICD-10-CM

## 2020-09-23 NOTE — Progress Notes (Signed)
  Medical Nutrition Therapy:  Appt start time:  1110  end time:1145  Assessment:  Primary concerns today: Diabetes Type 2-diet controlled and obesity.  A1C 6.1.%  Has made EXCELLENT progress! FBS 98-130 mg/dl. Feels a lot better. Lost 37 lbs in the last 5 months. A1C was 5.5%, down 6.1% and  not over eating. Eating a lot more vegetables, fruit and staying active. Doing excellent. Feels great.   Vitals with BMI 05/08/2018 04/24/2018 02/04/2018  Height '6\' 1"'$  '6\' 0"'$  '6\' 0"'$   Weight 302 lbs 308 lbs 331 lbs  BMI 39.85 [id number]  Systolic  123456   Diastolic  72   Pulse  55     Preferred Learning Style:  No preference indicated   Learning Readiness: Ready Change in progress   MEDICATIONS: see list   DIETARY INTAKE:.    24-hr recall:  B ( AM): Eggs and Bran cereal,  Snacks: L)  Salad with chicken,  Water. D)  Meat and vegetables, water Usual physical activity:  walking 30-45 mins a day  Estimated energy needs: 1800 calories 200 g carbohydrates  135 g protein 50 g fat  Progress Towards Goal(s):  In progress.   Nutritional Diagnosis:  NB-1.1 Food and nutrition-related knowledge deficit As related to Prediabetes, CKD, HTN and hyperlipidemia.  As evidenced by A1C 6.2%, Urine Creatine 50 and  low HDL 27. .    Intervention:  Need for calorie reduction, reduced portion sizes and increased activity for needed weight loss. Reviewed benefits of exercising 30 minutes per day and prevention of complications of DM. Low Sodium Low Fat High Fiber diet. Weight loss tips.  Goals  Keep up great job. Increase walking to 45 minutes daily. Lose 2-3 lbs per month    Teaching Method Utilized:  Visual Auditory Hands on  Handouts given during visit include: The Plate Method  Meal Plan Card Diabetes Instructions.   Barriers to learning/adherence to lifestyle change: none  Demonstrated degree of understanding via:  Teach Back   Monitoring/Evaluation:  Dietary intake, exercise, meal  planning, SBG, and body weight in 6 months.

## 2020-09-23 NOTE — Patient Instructions (Addendum)
Goals  Keep up great job. Increase walking to 45 minutes daily. Lose 2-3 lbs per month

## 2020-10-04 DIAGNOSIS — L663 Perifolliculitis capitis abscedens: Secondary | ICD-10-CM | POA: Diagnosis not present

## 2020-10-07 ENCOUNTER — Encounter: Payer: Self-pay | Admitting: Nutrition

## 2020-11-15 DIAGNOSIS — Z23 Encounter for immunization: Secondary | ICD-10-CM | POA: Diagnosis not present

## 2021-02-28 DIAGNOSIS — Z299 Encounter for prophylactic measures, unspecified: Secondary | ICD-10-CM | POA: Diagnosis not present

## 2021-02-28 DIAGNOSIS — E1129 Type 2 diabetes mellitus with other diabetic kidney complication: Secondary | ICD-10-CM | POA: Diagnosis not present

## 2021-02-28 DIAGNOSIS — J069 Acute upper respiratory infection, unspecified: Secondary | ICD-10-CM | POA: Diagnosis not present

## 2021-02-28 DIAGNOSIS — E1165 Type 2 diabetes mellitus with hyperglycemia: Secondary | ICD-10-CM | POA: Diagnosis not present

## 2021-02-28 DIAGNOSIS — I10 Essential (primary) hypertension: Secondary | ICD-10-CM | POA: Diagnosis not present

## 2021-03-28 ENCOUNTER — Encounter: Payer: Medicare Other | Attending: Internal Medicine | Admitting: Nutrition

## 2021-03-28 ENCOUNTER — Other Ambulatory Visit: Payer: Self-pay

## 2021-03-28 DIAGNOSIS — E119 Type 2 diabetes mellitus without complications: Secondary | ICD-10-CM | POA: Diagnosis not present

## 2021-03-28 DIAGNOSIS — I10 Essential (primary) hypertension: Secondary | ICD-10-CM

## 2021-03-28 DIAGNOSIS — I2510 Atherosclerotic heart disease of native coronary artery without angina pectoris: Secondary | ICD-10-CM

## 2021-03-28 DIAGNOSIS — E782 Mixed hyperlipidemia: Secondary | ICD-10-CM

## 2021-03-28 DIAGNOSIS — Z713 Dietary counseling and surveillance: Secondary | ICD-10-CM | POA: Insufficient documentation

## 2021-03-28 NOTE — Patient Instructions (Signed)
Goals  Lose 5 lbs per month Keep eating more plant based foods.

## 2021-03-28 NOTE — Progress Notes (Signed)
°  Medical Nutrition Therapy:  Appt start time:  1110  end time:1145  Assessment:  Primary concerns today: Diabetes Type 2-diet controlled and obesity.    Here with his wife. He notes he has been working on [employer]. He reports his A1C improved. A1C down to 5.8%  BS 90-100's in am. Eating a lot more vegebles and drinking only water   Vitals with BMI 05/08/2018 04/24/2018 02/04/2018  Height 6\' 1"  6\' 0"  6\' 0"   Weight 302 lbs 308 lbs 331 lbs  BMI 39.85 02.63 78.58  Systolic  850   Diastolic  72   Pulse  55     Preferred Learning Style:  No preference indicated   Learning Readiness: Ready Change in progress   MEDICATIONS: see list   DIETARY INTAKE:.    24-hr recall:  B ( AM): Eggs and Bran cereal,  Snacks: L)  Salad with chicken,  Water. D)   meatloaf,  Usual physical activity:  walking 30-45 mins a day  Estimated energy needs: 1800 calories 200 g carbohydrates  135 g protein 50 g fat  Progress Towards Goal(s):  In progress.   Nutritional Diagnosis:  NB-1.1 Food and nutrition-related knowledge deficit As related to Prediabetes, CKD, HTN and hyperlipidemia.  As evidenced by A1C 6.2%, Urine Creatine 50 and  low HDL 27. .    Intervention:  Need for calorie reduction, reduced portion sizes and increased activity for needed weight loss. Reviewed benefits of exercising 30 minutes per day and prevention of complications of DM. Low Sodium Low Fat High Fiber diet. Weight loss tips.  Goals  Keep up great job. Increase walking to 45 minutes daily. Lose 2-3 lbs per month    Teaching Method Utilized:  Visual Auditory Hands on  Handouts given during visit include: The Plate Method  Meal Plan Card Diabetes Instructions.   Barriers to learning/adherence to lifestyle change: none  Demonstrated degree of understanding via:  Teach Back   Monitoring/Evaluation:  Dietary intake, exercise, meal planning, SBG, and body weight in 6 months.

## 2021-04-04 DIAGNOSIS — Z85828 Personal history of other malignant neoplasm of skin: Secondary | ICD-10-CM | POA: Diagnosis not present

## 2021-04-04 DIAGNOSIS — L719 Rosacea, unspecified: Secondary | ICD-10-CM | POA: Diagnosis not present

## 2021-04-04 DIAGNOSIS — L570 Actinic keratosis: Secondary | ICD-10-CM | POA: Diagnosis not present

## 2021-04-18 ENCOUNTER — Encounter: Payer: Self-pay | Admitting: Nutrition

## 2021-05-03 DIAGNOSIS — Z20822 Contact with and (suspected) exposure to covid-19: Secondary | ICD-10-CM | POA: Diagnosis not present

## 2021-05-25 DIAGNOSIS — I10 Essential (primary) hypertension: Secondary | ICD-10-CM | POA: Diagnosis not present

## 2021-05-25 DIAGNOSIS — Z6841 Body Mass Index (BMI) 40.0 and over, adult: Secondary | ICD-10-CM | POA: Diagnosis not present

## 2021-05-25 DIAGNOSIS — Z713 Dietary counseling and surveillance: Secondary | ICD-10-CM | POA: Diagnosis not present

## 2021-05-25 DIAGNOSIS — Z299 Encounter for prophylactic measures, unspecified: Secondary | ICD-10-CM | POA: Diagnosis not present

## 2021-05-25 DIAGNOSIS — H6690 Otitis media, unspecified, unspecified ear: Secondary | ICD-10-CM | POA: Diagnosis not present

## 2021-06-01 DIAGNOSIS — R5383 Other fatigue: Secondary | ICD-10-CM | POA: Diagnosis not present

## 2021-06-01 DIAGNOSIS — Z1339 Encounter for screening examination for other mental health and behavioral disorders: Secondary | ICD-10-CM | POA: Diagnosis not present

## 2021-06-01 DIAGNOSIS — Z79899 Other long term (current) drug therapy: Secondary | ICD-10-CM | POA: Diagnosis not present

## 2021-06-01 DIAGNOSIS — Z299 Encounter for prophylactic measures, unspecified: Secondary | ICD-10-CM | POA: Diagnosis not present

## 2021-06-01 DIAGNOSIS — Z125 Encounter for screening for malignant neoplasm of prostate: Secondary | ICD-10-CM | POA: Diagnosis not present

## 2021-06-01 DIAGNOSIS — Z1331 Encounter for screening for depression: Secondary | ICD-10-CM | POA: Diagnosis not present

## 2021-06-01 DIAGNOSIS — E7800 Pure hypercholesterolemia, unspecified: Secondary | ICD-10-CM | POA: Diagnosis not present

## 2021-06-01 DIAGNOSIS — Z6841 Body Mass Index (BMI) 40.0 and over, adult: Secondary | ICD-10-CM | POA: Diagnosis not present

## 2021-06-01 DIAGNOSIS — Z7189 Other specified counseling: Secondary | ICD-10-CM | POA: Diagnosis not present

## 2021-06-01 DIAGNOSIS — I10 Essential (primary) hypertension: Secondary | ICD-10-CM | POA: Diagnosis not present

## 2021-06-01 DIAGNOSIS — Z1211 Encounter for screening for malignant neoplasm of colon: Secondary | ICD-10-CM | POA: Diagnosis not present

## 2021-06-01 DIAGNOSIS — Z0000 Encounter for general adult medical examination without abnormal findings: Secondary | ICD-10-CM | POA: Diagnosis not present

## 2021-06-01 DIAGNOSIS — Z87891 Personal history of nicotine dependence: Secondary | ICD-10-CM | POA: Diagnosis not present

## 2021-06-27 DIAGNOSIS — Z20822 Contact with and (suspected) exposure to covid-19: Secondary | ICD-10-CM | POA: Diagnosis not present

## 2021-06-30 DIAGNOSIS — G4730 Sleep apnea, unspecified: Secondary | ICD-10-CM | POA: Diagnosis not present

## 2021-06-30 DIAGNOSIS — Z299 Encounter for prophylactic measures, unspecified: Secondary | ICD-10-CM | POA: Diagnosis not present

## 2021-06-30 DIAGNOSIS — J329 Chronic sinusitis, unspecified: Secondary | ICD-10-CM | POA: Diagnosis not present

## 2021-06-30 DIAGNOSIS — I10 Essential (primary) hypertension: Secondary | ICD-10-CM | POA: Diagnosis not present

## 2021-07-13 DIAGNOSIS — M7551 Bursitis of right shoulder: Secondary | ICD-10-CM | POA: Diagnosis not present

## 2021-07-13 DIAGNOSIS — Z6841 Body Mass Index (BMI) 40.0 and over, adult: Secondary | ICD-10-CM | POA: Diagnosis not present

## 2021-07-13 DIAGNOSIS — Z299 Encounter for prophylactic measures, unspecified: Secondary | ICD-10-CM | POA: Diagnosis not present

## 2021-07-13 DIAGNOSIS — E1165 Type 2 diabetes mellitus with hyperglycemia: Secondary | ICD-10-CM | POA: Diagnosis not present

## 2021-07-13 DIAGNOSIS — I10 Essential (primary) hypertension: Secondary | ICD-10-CM | POA: Diagnosis not present

## 2021-07-13 DIAGNOSIS — Z789 Other specified health status: Secondary | ICD-10-CM | POA: Diagnosis not present

## 2021-08-01 DIAGNOSIS — M25511 Pain in right shoulder: Secondary | ICD-10-CM | POA: Diagnosis not present

## 2021-08-01 DIAGNOSIS — Z299 Encounter for prophylactic measures, unspecified: Secondary | ICD-10-CM | POA: Diagnosis not present

## 2021-08-01 DIAGNOSIS — E1165 Type 2 diabetes mellitus with hyperglycemia: Secondary | ICD-10-CM | POA: Diagnosis not present

## 2021-08-01 DIAGNOSIS — Z6841 Body Mass Index (BMI) 40.0 and over, adult: Secondary | ICD-10-CM | POA: Diagnosis not present

## 2021-08-01 DIAGNOSIS — M19011 Primary osteoarthritis, right shoulder: Secondary | ICD-10-CM | POA: Diagnosis not present

## 2021-08-01 DIAGNOSIS — I10 Essential (primary) hypertension: Secondary | ICD-10-CM | POA: Diagnosis not present

## 2021-08-03 DIAGNOSIS — M25511 Pain in right shoulder: Secondary | ICD-10-CM | POA: Diagnosis not present

## 2021-08-03 DIAGNOSIS — I10 Essential (primary) hypertension: Secondary | ICD-10-CM | POA: Diagnosis not present

## 2021-08-03 DIAGNOSIS — Z6841 Body Mass Index (BMI) 40.0 and over, adult: Secondary | ICD-10-CM | POA: Diagnosis not present

## 2021-08-03 DIAGNOSIS — Z299 Encounter for prophylactic measures, unspecified: Secondary | ICD-10-CM | POA: Diagnosis not present

## 2021-08-05 DIAGNOSIS — I10 Essential (primary) hypertension: Secondary | ICD-10-CM | POA: Diagnosis not present

## 2021-08-05 DIAGNOSIS — Z87891 Personal history of nicotine dependence: Secondary | ICD-10-CM | POA: Diagnosis not present

## 2021-08-05 DIAGNOSIS — M778 Other enthesopathies, not elsewhere classified: Secondary | ICD-10-CM | POA: Diagnosis not present

## 2021-08-05 DIAGNOSIS — Z299 Encounter for prophylactic measures, unspecified: Secondary | ICD-10-CM | POA: Diagnosis not present

## 2021-08-08 DIAGNOSIS — M7711 Lateral epicondylitis, right elbow: Secondary | ICD-10-CM | POA: Diagnosis not present

## 2021-08-08 DIAGNOSIS — M79631 Pain in right forearm: Secondary | ICD-10-CM | POA: Diagnosis not present

## 2021-08-08 DIAGNOSIS — R200 Anesthesia of skin: Secondary | ICD-10-CM | POA: Diagnosis not present

## 2021-08-08 DIAGNOSIS — R202 Paresthesia of skin: Secondary | ICD-10-CM | POA: Diagnosis not present

## 2021-08-10 DIAGNOSIS — Z299 Encounter for prophylactic measures, unspecified: Secondary | ICD-10-CM | POA: Diagnosis not present

## 2021-08-10 DIAGNOSIS — I10 Essential (primary) hypertension: Secondary | ICD-10-CM | POA: Diagnosis not present

## 2021-08-10 DIAGNOSIS — R079 Chest pain, unspecified: Secondary | ICD-10-CM | POA: Diagnosis not present

## 2021-08-10 DIAGNOSIS — Z6841 Body Mass Index (BMI) 40.0 and over, adult: Secondary | ICD-10-CM | POA: Diagnosis not present

## 2021-08-10 DIAGNOSIS — Z713 Dietary counseling and surveillance: Secondary | ICD-10-CM | POA: Diagnosis not present

## 2021-08-10 DIAGNOSIS — M79603 Pain in arm, unspecified: Secondary | ICD-10-CM | POA: Diagnosis not present

## 2021-08-12 DIAGNOSIS — M542 Cervicalgia: Secondary | ICD-10-CM | POA: Diagnosis not present

## 2021-08-15 DIAGNOSIS — Z299 Encounter for prophylactic measures, unspecified: Secondary | ICD-10-CM | POA: Diagnosis not present

## 2021-08-15 DIAGNOSIS — Z6841 Body Mass Index (BMI) 40.0 and over, adult: Secondary | ICD-10-CM | POA: Diagnosis not present

## 2021-08-15 DIAGNOSIS — M778 Other enthesopathies, not elsewhere classified: Secondary | ICD-10-CM | POA: Diagnosis not present

## 2021-08-15 DIAGNOSIS — I10 Essential (primary) hypertension: Secondary | ICD-10-CM | POA: Diagnosis not present

## 2021-08-15 DIAGNOSIS — E1165 Type 2 diabetes mellitus with hyperglycemia: Secondary | ICD-10-CM | POA: Diagnosis not present

## 2021-08-15 DIAGNOSIS — R600 Localized edema: Secondary | ICD-10-CM | POA: Diagnosis not present

## 2021-08-16 DIAGNOSIS — R911 Solitary pulmonary nodule: Secondary | ICD-10-CM | POA: Diagnosis not present

## 2021-08-16 DIAGNOSIS — R200 Anesthesia of skin: Secondary | ICD-10-CM | POA: Diagnosis not present

## 2021-08-16 DIAGNOSIS — R918 Other nonspecific abnormal finding of lung field: Secondary | ICD-10-CM | POA: Diagnosis not present

## 2021-08-16 DIAGNOSIS — M25519 Pain in unspecified shoulder: Secondary | ICD-10-CM | POA: Diagnosis not present

## 2021-08-16 DIAGNOSIS — I700 Atherosclerosis of aorta: Secondary | ICD-10-CM | POA: Diagnosis not present

## 2021-08-16 DIAGNOSIS — R202 Paresthesia of skin: Secondary | ICD-10-CM | POA: Diagnosis not present

## 2021-08-19 DIAGNOSIS — Z299 Encounter for prophylactic measures, unspecified: Secondary | ICD-10-CM | POA: Diagnosis not present

## 2021-08-19 DIAGNOSIS — M79603 Pain in arm, unspecified: Secondary | ICD-10-CM | POA: Diagnosis not present

## 2021-08-19 DIAGNOSIS — M542 Cervicalgia: Secondary | ICD-10-CM | POA: Diagnosis not present

## 2021-08-19 DIAGNOSIS — Z6841 Body Mass Index (BMI) 40.0 and over, adult: Secondary | ICD-10-CM | POA: Diagnosis not present

## 2021-08-19 DIAGNOSIS — Z713 Dietary counseling and surveillance: Secondary | ICD-10-CM | POA: Diagnosis not present

## 2021-08-20 DIAGNOSIS — M4802 Spinal stenosis, cervical region: Secondary | ICD-10-CM | POA: Diagnosis not present

## 2021-08-20 DIAGNOSIS — M4722 Other spondylosis with radiculopathy, cervical region: Secondary | ICD-10-CM | POA: Diagnosis not present

## 2021-08-22 DIAGNOSIS — I10 Essential (primary) hypertension: Secondary | ICD-10-CM | POA: Diagnosis not present

## 2021-08-22 DIAGNOSIS — M542 Cervicalgia: Secondary | ICD-10-CM | POA: Diagnosis not present

## 2021-08-22 DIAGNOSIS — Z6841 Body Mass Index (BMI) 40.0 and over, adult: Secondary | ICD-10-CM | POA: Diagnosis not present

## 2021-08-22 DIAGNOSIS — Z789 Other specified health status: Secondary | ICD-10-CM | POA: Diagnosis not present

## 2021-08-22 DIAGNOSIS — M79603 Pain in arm, unspecified: Secondary | ICD-10-CM | POA: Diagnosis not present

## 2021-08-22 DIAGNOSIS — Z299 Encounter for prophylactic measures, unspecified: Secondary | ICD-10-CM | POA: Diagnosis not present

## 2021-08-29 DIAGNOSIS — Z6841 Body Mass Index (BMI) 40.0 and over, adult: Secondary | ICD-10-CM | POA: Diagnosis not present

## 2021-08-29 DIAGNOSIS — M5412 Radiculopathy, cervical region: Secondary | ICD-10-CM | POA: Diagnosis not present

## 2021-09-13 DIAGNOSIS — M2578 Osteophyte, vertebrae: Secondary | ICD-10-CM | POA: Diagnosis not present

## 2021-09-13 DIAGNOSIS — M4802 Spinal stenosis, cervical region: Secondary | ICD-10-CM | POA: Diagnosis not present

## 2021-09-13 DIAGNOSIS — M5412 Radiculopathy, cervical region: Secondary | ICD-10-CM | POA: Diagnosis not present

## 2021-10-31 DIAGNOSIS — Z6841 Body Mass Index (BMI) 40.0 and over, adult: Secondary | ICD-10-CM | POA: Diagnosis not present

## 2021-10-31 DIAGNOSIS — M5412 Radiculopathy, cervical region: Secondary | ICD-10-CM | POA: Diagnosis not present

## 2021-11-30 DIAGNOSIS — C44519 Basal cell carcinoma of skin of other part of trunk: Secondary | ICD-10-CM | POA: Diagnosis not present

## 2021-11-30 DIAGNOSIS — D485 Neoplasm of uncertain behavior of skin: Secondary | ICD-10-CM | POA: Diagnosis not present

## 2021-11-30 DIAGNOSIS — L570 Actinic keratosis: Secondary | ICD-10-CM | POA: Diagnosis not present

## 2021-12-08 DIAGNOSIS — Z87891 Personal history of nicotine dependence: Secondary | ICD-10-CM | POA: Diagnosis not present

## 2021-12-08 DIAGNOSIS — J449 Chronic obstructive pulmonary disease, unspecified: Secondary | ICD-10-CM | POA: Diagnosis not present

## 2021-12-08 DIAGNOSIS — I10 Essential (primary) hypertension: Secondary | ICD-10-CM | POA: Diagnosis not present

## 2021-12-08 DIAGNOSIS — Z4801 Encounter for change or removal of surgical wound dressing: Secondary | ICD-10-CM | POA: Diagnosis not present

## 2021-12-08 DIAGNOSIS — E7800 Pure hypercholesterolemia, unspecified: Secondary | ICD-10-CM | POA: Diagnosis not present

## 2021-12-08 DIAGNOSIS — C44509 Unspecified malignant neoplasm of skin of other part of trunk: Secondary | ICD-10-CM | POA: Diagnosis not present

## 2021-12-08 DIAGNOSIS — C44519 Basal cell carcinoma of skin of other part of trunk: Secondary | ICD-10-CM | POA: Diagnosis not present

## 2021-12-12 DIAGNOSIS — M5412 Radiculopathy, cervical region: Secondary | ICD-10-CM | POA: Diagnosis not present

## 2021-12-14 DIAGNOSIS — Z23 Encounter for immunization: Secondary | ICD-10-CM | POA: Diagnosis not present

## 2022-02-03 DIAGNOSIS — J329 Chronic sinusitis, unspecified: Secondary | ICD-10-CM | POA: Diagnosis not present

## 2022-02-03 DIAGNOSIS — Z299 Encounter for prophylactic measures, unspecified: Secondary | ICD-10-CM | POA: Diagnosis not present

## 2022-02-03 DIAGNOSIS — R6884 Jaw pain: Secondary | ICD-10-CM | POA: Diagnosis not present

## 2022-03-29 DIAGNOSIS — I700 Atherosclerosis of aorta: Secondary | ICD-10-CM | POA: Diagnosis not present

## 2022-03-29 DIAGNOSIS — R809 Proteinuria, unspecified: Secondary | ICD-10-CM | POA: Diagnosis not present

## 2022-03-29 DIAGNOSIS — E1129 Type 2 diabetes mellitus with other diabetic kidney complication: Secondary | ICD-10-CM | POA: Diagnosis not present

## 2022-03-29 DIAGNOSIS — J069 Acute upper respiratory infection, unspecified: Secondary | ICD-10-CM | POA: Diagnosis not present

## 2022-03-29 DIAGNOSIS — Z299 Encounter for prophylactic measures, unspecified: Secondary | ICD-10-CM | POA: Diagnosis not present

## 2022-04-03 DIAGNOSIS — Z09 Encounter for follow-up examination after completed treatment for conditions other than malignant neoplasm: Secondary | ICD-10-CM | POA: Diagnosis not present

## 2022-04-03 DIAGNOSIS — Z789 Other specified health status: Secondary | ICD-10-CM | POA: Diagnosis not present

## 2022-04-03 DIAGNOSIS — J069 Acute upper respiratory infection, unspecified: Secondary | ICD-10-CM | POA: Diagnosis not present

## 2022-04-03 DIAGNOSIS — E1165 Type 2 diabetes mellitus with hyperglycemia: Secondary | ICD-10-CM | POA: Diagnosis not present

## 2022-04-03 DIAGNOSIS — J189 Pneumonia, unspecified organism: Secondary | ICD-10-CM | POA: Diagnosis not present

## 2022-04-03 DIAGNOSIS — Z299 Encounter for prophylactic measures, unspecified: Secondary | ICD-10-CM | POA: Diagnosis not present

## 2022-04-03 DIAGNOSIS — R911 Solitary pulmonary nodule: Secondary | ICD-10-CM | POA: Diagnosis not present

## 2022-04-03 DIAGNOSIS — Z8701 Personal history of pneumonia (recurrent): Secondary | ICD-10-CM | POA: Diagnosis not present

## 2022-04-03 DIAGNOSIS — I700 Atherosclerosis of aorta: Secondary | ICD-10-CM | POA: Diagnosis not present

## 2022-04-20 DIAGNOSIS — E1165 Type 2 diabetes mellitus with hyperglycemia: Secondary | ICD-10-CM | POA: Diagnosis not present

## 2022-04-20 DIAGNOSIS — Z299 Encounter for prophylactic measures, unspecified: Secondary | ICD-10-CM | POA: Diagnosis not present

## 2022-04-20 DIAGNOSIS — R509 Fever, unspecified: Secondary | ICD-10-CM | POA: Diagnosis not present

## 2022-04-20 DIAGNOSIS — I10 Essential (primary) hypertension: Secondary | ICD-10-CM | POA: Diagnosis not present

## 2022-04-20 DIAGNOSIS — R0981 Nasal congestion: Secondary | ICD-10-CM | POA: Diagnosis not present

## 2022-05-18 DIAGNOSIS — H2513 Age-related nuclear cataract, bilateral: Secondary | ICD-10-CM | POA: Diagnosis not present

## 2022-05-18 DIAGNOSIS — E119 Type 2 diabetes mellitus without complications: Secondary | ICD-10-CM | POA: Diagnosis not present

## 2022-05-25 DIAGNOSIS — I10 Essential (primary) hypertension: Secondary | ICD-10-CM | POA: Diagnosis not present

## 2022-05-25 DIAGNOSIS — Z6841 Body Mass Index (BMI) 40.0 and over, adult: Secondary | ICD-10-CM | POA: Diagnosis not present

## 2022-05-25 DIAGNOSIS — M79606 Pain in leg, unspecified: Secondary | ICD-10-CM | POA: Diagnosis not present

## 2022-05-25 DIAGNOSIS — Z299 Encounter for prophylactic measures, unspecified: Secondary | ICD-10-CM | POA: Diagnosis not present

## 2022-05-25 DIAGNOSIS — I739 Peripheral vascular disease, unspecified: Secondary | ICD-10-CM | POA: Diagnosis not present

## 2022-05-25 DIAGNOSIS — R079 Chest pain, unspecified: Secondary | ICD-10-CM | POA: Diagnosis not present

## 2022-05-29 DIAGNOSIS — Z125 Encounter for screening for malignant neoplasm of prostate: Secondary | ICD-10-CM | POA: Diagnosis not present

## 2022-05-29 DIAGNOSIS — R5383 Other fatigue: Secondary | ICD-10-CM | POA: Diagnosis not present

## 2022-05-29 DIAGNOSIS — M79606 Pain in leg, unspecified: Secondary | ICD-10-CM | POA: Diagnosis not present

## 2022-05-29 DIAGNOSIS — E7800 Pure hypercholesterolemia, unspecified: Secondary | ICD-10-CM | POA: Diagnosis not present

## 2022-05-29 DIAGNOSIS — I70211 Atherosclerosis of native arteries of extremities with intermittent claudication, right leg: Secondary | ICD-10-CM | POA: Diagnosis not present

## 2022-06-05 ENCOUNTER — Encounter: Payer: Self-pay | Admitting: *Deleted

## 2022-06-05 DIAGNOSIS — Z1283 Encounter for screening for malignant neoplasm of skin: Secondary | ICD-10-CM | POA: Diagnosis not present

## 2022-06-05 DIAGNOSIS — Z85828 Personal history of other malignant neoplasm of skin: Secondary | ICD-10-CM | POA: Diagnosis not present

## 2022-06-05 DIAGNOSIS — L719 Rosacea, unspecified: Secondary | ICD-10-CM | POA: Diagnosis not present

## 2022-06-05 DIAGNOSIS — L570 Actinic keratosis: Secondary | ICD-10-CM | POA: Diagnosis not present

## 2022-06-05 DIAGNOSIS — D239 Other benign neoplasm of skin, unspecified: Secondary | ICD-10-CM | POA: Diagnosis not present

## 2022-06-06 ENCOUNTER — Encounter: Payer: Self-pay | Admitting: *Deleted

## 2022-06-06 ENCOUNTER — Telehealth: Payer: Self-pay | Admitting: Internal Medicine

## 2022-06-06 ENCOUNTER — Encounter: Payer: Self-pay | Admitting: Internal Medicine

## 2022-06-06 ENCOUNTER — Ambulatory Visit: Payer: Medicare Other | Attending: Internal Medicine | Admitting: Internal Medicine

## 2022-06-06 VITALS — BP 146/70 | HR 56 | Ht 72.0 in | Wt 310.6 lb

## 2022-06-06 DIAGNOSIS — E7800 Pure hypercholesterolemia, unspecified: Secondary | ICD-10-CM | POA: Diagnosis not present

## 2022-06-06 DIAGNOSIS — Z299 Encounter for prophylactic measures, unspecified: Secondary | ICD-10-CM | POA: Diagnosis not present

## 2022-06-06 DIAGNOSIS — I6529 Occlusion and stenosis of unspecified carotid artery: Secondary | ICD-10-CM | POA: Diagnosis not present

## 2022-06-06 DIAGNOSIS — Z1331 Encounter for screening for depression: Secondary | ICD-10-CM | POA: Diagnosis not present

## 2022-06-06 DIAGNOSIS — Z1339 Encounter for screening examination for other mental health and behavioral disorders: Secondary | ICD-10-CM | POA: Diagnosis not present

## 2022-06-06 DIAGNOSIS — R079 Chest pain, unspecified: Secondary | ICD-10-CM | POA: Diagnosis not present

## 2022-06-06 DIAGNOSIS — I700 Atherosclerosis of aorta: Secondary | ICD-10-CM | POA: Diagnosis not present

## 2022-06-06 DIAGNOSIS — I10 Essential (primary) hypertension: Secondary | ICD-10-CM | POA: Diagnosis not present

## 2022-06-06 DIAGNOSIS — R29898 Other symptoms and signs involving the musculoskeletal system: Secondary | ICD-10-CM

## 2022-06-06 DIAGNOSIS — Z0000 Encounter for general adult medical examination without abnormal findings: Secondary | ICD-10-CM | POA: Diagnosis not present

## 2022-06-06 DIAGNOSIS — Z7189 Other specified counseling: Secondary | ICD-10-CM | POA: Diagnosis not present

## 2022-06-06 DIAGNOSIS — Z8673 Personal history of transient ischemic attack (TIA), and cerebral infarction without residual deficits: Secondary | ICD-10-CM | POA: Diagnosis not present

## 2022-06-06 DIAGNOSIS — I739 Peripheral vascular disease, unspecified: Secondary | ICD-10-CM | POA: Diagnosis not present

## 2022-06-06 DIAGNOSIS — Z6841 Body Mass Index (BMI) 40.0 and over, adult: Secondary | ICD-10-CM | POA: Diagnosis not present

## 2022-06-06 MED ORDER — NITROGLYCERIN 0.4 MG SL SUBL: 0.4000 mg | SUBLINGUAL_TABLET | SUBLINGUAL | 3 refills | Status: DC | PRN

## 2022-06-06 NOTE — Patient Instructions (Addendum)
Medication Instructions:  Your physician recommends that you continue on your current medications as directed. Please refer to the Current Medication list given to you today. Nitroglycerin 0.4 mg tablets sent to your pharmacy. Dissolve one under tongue for chest pain every 5 minutes up to 3 doses. If no relief, proceed to ED.  Labwork: none  Testing/Procedures: Your physician has requested that you have an ankle brachial index (ABI). During this test an ultrasound and blood pressure cuff are used to evaluate the arteries that supply the arms and legs with blood. Allow thirty minutes for this exam. There are no restrictions or special instructions. Your physician has requested that you have a carotid duplex. This test is an ultrasound of the carotid arteries in your neck. It looks at blood flow through these arteries that supply the brain with blood. Allow one hour for this exam. There are no restrictions or special instructions. Your physician has requested that you have a lexiscan myoview. For further information please visit [email or web address] Please follow instruction sheet, as given.  Follow-Up: Your physician recommends that you schedule a follow-up appointment in: 1 year. You will receive a reminder call in the mail in about 10 months reminding you to call and schedule your appointment. If you don't receive this call, please contact our office.  Any Other Special Instructions Will Be Listed Below (If Applicable).  If you need a refill on your cardiac medications before your next appointment, please call your pharmacy.

## 2022-06-06 NOTE — Progress Notes (Signed)
Cardiology Office Note  Date: 06/06/2022   ID: Ammaar, Encina [birth year], MRN [id number]  PCP:  Kirstie Peri, MD  Cardiologist:  Marjo Bicker, MD Electrophysiologist:  None   Reason for Office Visit:    History of Present Illness: Logan Hunt is a 67 y.o. male known to have nonobstructive CAD by LHC in 2005, HTN, DM 2, HLD, history of TIA is here for follow-up visit.  Patient had 1 episode of chest pain when he walked his dog on an uphill.  The chest pain episode lasted for 30 to 40 minutes which scared him. He did not have any nitroglycerin pills to resolve the pain.  Did not have any recurrences delayed.  Denies any SOB/DOE, dizziness, lightheadedness, syncope, palpitations or leg swelling.  He does not check his blood pressures at home, instructed him to check BP in a.m. and p.m.  Patient underwent multiple NM stress tests which were all within normal limits, last NM stress test was in 2019.  He also underwent carotid duplex in 2016 that showed less than 39% carotid artery stenosis.  He reported having leg heaviness while walking uphill and also walking his dog.  Past Medical History:  Diagnosis Date   CAD (coronary artery disease)    Mild plaque 2005   Carotid artery disease    Diabetes mellitus without complication    DJD (degenerative joint disease)    HLD (hyperlipidemia)    Hypertension    Syncope    TIA (transient ischemic attack)     Past Surgical History:  Procedure Laterality Date   CARDIAC CATHETERIZATION     None      Current Outpatient Medications  Medication Sig Dispense Refill   albuterol (VENTOLIN HFA) 108 (90 Base) MCG/ACT inhaler Inhale into the lungs every 6 (six) hours as needed for wheezing or shortness of breath.     aspirin EC 81 MG tablet Take 1 tablet (81 mg total) by mouth daily.     doxycycline (DORYX) 100 MG EC tablet Take 100 mg by mouth daily.     lisinopril (ZESTRIL) 40 MG tablet Take 40 mg by mouth daily.      lovastatin (MEVACOR) 40 MG tablet Take 80 mg by mouth at bedtime.      omeprazole (PRILOSEC) 40 MG capsule Take 1 capsule by mouth daily.     No current facility-administered medications for this visit.   Allergies:  Patient has no known allergies.   Social History: The patient  reports that he quit smoking about 15 years ago. His smoking use included cigarettes. He started smoking about 52 years ago. He has a 74.00 pack-year smoking history. He has never used smokeless tobacco. He reports that he does not drink alcohol and does not use drugs.   Family History: The patient's family history includes Breast cancer in his mother; Diabetes in his father; Heart disease in his father; Lung cancer in his mother.   ROS:  Please see the history of present illness. Otherwise, complete review of systems is positive for none.  All other systems are reviewed and negative.   Physical Exam: VS:  BP (!) 146/70 (BP Location: Left Arm, Cuff Size: Large)   Pulse (!) 56   Ht 6' (1.829 m)   Wt (!) 310 lb 9.6 oz (140.9 kg)   SpO2 95%   BMI 42.12 kg/m , BMI Body mass index is 42.12 kg/m.  Wt Readings from Last 3 Encounters:  06/06/22 (!) 310 lb 9.6  oz (140.9 kg)  03/28/21 295 lb 9.6 oz (134.1 kg)  09/23/20 294 lb (133.4 kg)    General: Patient appears comfortable at rest. HEENT: Conjunctiva and lids normal, oropharynx clear with moist mucosa. Neck: Supple, no elevated JVP or carotid bruits, no thyromegaly. Lungs: Clear to auscultation, nonlabored breathing at rest. Cardiac: Regular rate and rhythm, no S3 or significant systolic murmur, no pericardial rub. Abdomen: Soft, nontender, no hepatomegaly, bowel sounds present, no guarding or rebound. Extremities: No pitting edema, distal pulses 2+. Skin: Warm and dry. Musculoskeletal: No kyphosis. Neuropsychiatric: Alert and oriented x3, affect grossly appropriate.  ECG:  NSR  Recent Labwork: No results found for requested labs within last 365 days.  No  results found for: "CHOL", "TRIG", "HDL", "CHOLHDL", "VLDL", "LDLCALC", "LDLDIRECT"  Other Studies Reviewed Today:   Assessment and Plan: Patient is a 67 year old M known to have nonobstructive CAD by LHC in 2005, HTN, DM 2, HLD, history of TIA is here for follow-up visit.  # Chest pain: Obtain Lexiscan, SL NTG 0.4 mg as needed # Leg heaviness: Obtain ABI with ultrasound arterial Doppler lower extremities # Carotid artery stenosis: He has 1 to 39% bilateral carotid stenosis per ultrasound in 2016.  Will obtain repeat carotid duplex. # History of TIA: Continue aspirin 81 mg once daily and lovastatin 80 mg nightly.  LDL goal less than 70. # HLD: Continue lovastatin 80 mg nightly, LDL goal less than 70. # HTN, partially controlled: Continue lisinopril 40 mg once daily  I have spent a total of 30 minutes with patient reviewing chart, EKGs, labs and examining patient as well as establishing an assessment and plan that was discussed with the patient.  > 50% of time was spent in direct patient care.      Medication Adjustments/Labs and Tests Ordered: Current medicines are reviewed at length with the patient today.  Concerns regarding medicines are outlined above.   Tests Ordered: Orders Placed This Encounter  Procedures   EKG 12-Lead    Medication Changes: No orders of the defined types were placed in this encounter.   Disposition:  Follow up  1 year  Signed Braeley Buskey Verne Spurr, MD, 06/06/2022 11:03 AM    [facility] at [facility] [address] [place], [place], [place] [address]

## 2022-06-06 NOTE — Telephone Encounter (Signed)
Checking percert on the following patient for testing scheduled at [facility].   Lexsican dx: chest pain 06/16/2022

## 2022-06-14 ENCOUNTER — Other Ambulatory Visit: Payer: Self-pay | Admitting: Internal Medicine

## 2022-06-14 DIAGNOSIS — I6529 Occlusion and stenosis of unspecified carotid artery: Secondary | ICD-10-CM

## 2022-06-14 DIAGNOSIS — I10 Essential (primary) hypertension: Secondary | ICD-10-CM

## 2022-06-14 DIAGNOSIS — R29898 Other symptoms and signs involving the musculoskeletal system: Secondary | ICD-10-CM

## 2022-06-14 DIAGNOSIS — Z8673 Personal history of transient ischemic attack (TIA), and cerebral infarction without residual deficits: Secondary | ICD-10-CM

## 2022-06-14 DIAGNOSIS — R079 Chest pain, unspecified: Secondary | ICD-10-CM

## 2022-06-14 DIAGNOSIS — I739 Peripheral vascular disease, unspecified: Secondary | ICD-10-CM

## 2022-06-16 ENCOUNTER — Ambulatory Visit (HOSPITAL_COMMUNITY)
Admission: RE | Admit: 2022-06-16 | Discharge: 2022-06-16 | Disposition: A | Discharge status: Home / Self Care | Payer: Medicare Other | Source: Ambulatory Visit | Attending: Internal Medicine | Admitting: Internal Medicine

## 2022-06-16 ENCOUNTER — Encounter (HOSPITAL_COMMUNITY): Payer: Self-pay

## 2022-06-16 DIAGNOSIS — R079 Chest pain, unspecified: Secondary | ICD-10-CM | POA: Diagnosis not present

## 2022-06-16 LAB — NM MYOCAR MULTI W/SPECT W/WALL MOTION / EF
Base ST Depression (mm): 0 mm
LV dias vol: 142 mL (ref 62–150)
LV sys vol: 51 mL
Nuc Stress EF: 64 %
Peak HR: 80 {beats}/min
RATE: 0.4
Rest HR: 60 {beats}/min
Rest Nuclear Isotope Dose: 10.4 mCi
SDS: 2
SRS: 5
SSS: 7
ST Depression (mm): 0 mm
Stress Nuclear Isotope Dose: 33 mCi
TID: 1.41

## 2022-06-16 MED ORDER — SODIUM CHLORIDE FLUSH 0.9 % IV SOLN
INTRAVENOUS | Status: AC
  Administered 2022-06-16: 10 mL via INTRAVENOUS
  Filled 2022-06-16: qty 10

## 2022-06-16 MED ORDER — TECHNETIUM TC 99M TETROFOSMIN IV KIT
10.0000 | PACK | Freq: Once | INTRAVENOUS | Status: AC | PRN
  Administered 2022-06-16: 10.4 via INTRAVENOUS

## 2022-06-16 MED ORDER — TECHNETIUM TC 99M TETROFOSMIN IV KIT
30.0000 | PACK | Freq: Once | INTRAVENOUS | Status: AC | PRN
  Administered 2022-06-16: 33 via INTRAVENOUS

## 2022-06-16 MED ORDER — REGADENOSON 0.4 MG/5ML IV SOLN
INTRAVENOUS | Status: AC
  Administered 2022-06-16: 0.4 mg via INTRAVENOUS
  Filled 2022-06-16: qty 5

## 2022-06-29 ENCOUNTER — Ambulatory Visit: Payer: Medicare Other | Attending: Internal Medicine

## 2022-06-29 DIAGNOSIS — I6529 Occlusion and stenosis of unspecified carotid artery: Secondary | ICD-10-CM | POA: Diagnosis not present

## 2022-06-29 DIAGNOSIS — I6521 Occlusion and stenosis of right carotid artery: Secondary | ICD-10-CM | POA: Diagnosis not present

## 2022-07-04 ENCOUNTER — Telehealth: Payer: Self-pay | Admitting: *Deleted

## 2022-07-04 DIAGNOSIS — R29898 Other symptoms and signs involving the musculoskeletal system: Secondary | ICD-10-CM

## 2022-07-04 NOTE — Telephone Encounter (Signed)
-----   Message from Marjo Bicker, MD sent at 06/30/2022  9:35 AM EDT ----- Regarding: ABI Obtain ABIs. USG arterial LE report from PCP did not have ABI. In addition, to refer to SET for PAD, ABIs are required.

## 2022-07-04 NOTE — Telephone Encounter (Signed)
Patient informed and verbalized understanding of plan. 

## 2022-07-10 ENCOUNTER — Ambulatory Visit (HOSPITAL_COMMUNITY)
Admission: RE | Admit: 2022-07-10 | Discharge: 2022-07-10 | Disposition: A | Discharge status: Home / Self Care | Payer: Medicare Other | Source: Ambulatory Visit | Attending: Internal Medicine | Admitting: Internal Medicine

## 2022-07-10 DIAGNOSIS — R29898 Other symptoms and signs involving the musculoskeletal system: Secondary | ICD-10-CM | POA: Diagnosis not present

## 2022-07-10 DIAGNOSIS — M79604 Pain in right leg: Secondary | ICD-10-CM | POA: Diagnosis not present

## 2022-07-10 DIAGNOSIS — M79605 Pain in left leg: Secondary | ICD-10-CM | POA: Diagnosis not present

## 2022-07-19 ENCOUNTER — Telehealth: Payer: Self-pay

## 2022-07-19 NOTE — Telephone Encounter (Signed)
-----   Message from Marjo Bicker, MD sent at 07/18/2022 12:38 PM EDT ----- Normal ABI.  No evidence of poor circulation in the lower extremities.

## 2022-07-19 NOTE — Telephone Encounter (Signed)
Patient notified and verbalized understanding. Patient had no questions or concerns at this time. PCP copied 

## 2022-08-03 ENCOUNTER — Ambulatory Visit: Payer: Medicare Other | Admitting: Internal Medicine

## 2022-08-03 DIAGNOSIS — Z299 Encounter for prophylactic measures, unspecified: Secondary | ICD-10-CM | POA: Diagnosis not present

## 2022-08-03 DIAGNOSIS — I10 Essential (primary) hypertension: Secondary | ICD-10-CM | POA: Diagnosis not present

## 2022-08-03 DIAGNOSIS — E1165 Type 2 diabetes mellitus with hyperglycemia: Secondary | ICD-10-CM | POA: Diagnosis not present

## 2022-10-10 ENCOUNTER — Telehealth: Payer: Self-pay | Admitting: *Deleted

## 2022-10-10 NOTE — Progress Notes (Signed)
  Care Coordination  Health Equity Plan   Note   10/10/2022 Name: Logan Hunt MRN: [id number] DOB: [birth year]  [name] is a 67 y.o. year old male who sees Kirstie Peri, MD for primary care. I reached out to [name] by phone today to offer care coordination services.  [name] was given information about Care Coordination services today including:   The Care Coordination services include support from the care team which includes your Nurse Coordinator, Clinical Social Worker, or Pharmacist.  The Care Coordination team is here to help remove barriers to the health concerns and goals most important to you. Care Coordination services are voluntary, and the patient may decline or stop services at any time by request to their care team member.   Care Coordination Consent Status: Patient agreed to services and verbal consent obtained.   Follow up plan:  Telephone appointment with care coordination team member scheduled for:  10/27/22  Encounter Outcome:  Pt. Scheduled  [facility] Coordination Care Guide  Direct Dial: [phone]

## 2022-10-27 ENCOUNTER — Encounter: Payer: Self-pay | Admitting: *Deleted

## 2022-10-27 ENCOUNTER — Ambulatory Visit: Payer: Self-pay | Admitting: *Deleted

## 2022-10-27 NOTE — Patient Outreach (Addendum)
Care Coordination   Health Equity Plan Initial Visit Note   10/27/2022 Name: Logan Hunt MRN: [id number] DOB: 1955/09/21  Logan Hunt is a 67 y.o. year old male who sees Kirstie Peri, MD for primary care. I spoke with  [name] by phone today.  What matters to the patients health and wellness today?  Managing blood sugar    Goals      Over the next 6 months patient will have an A1C of less than 7.0%     Care Coordination Goals: Patient will keep appointment with Dr Sherryll Burger on 11/06/22 Patient will continue to follow a modified carbohydrate diet and eat 3 meals a day with around 30 grams of carbohydrates and up to 2 snacks per day with less than 15 grams of carbohydrates Patient will reference handout on non-starchy vegetables and meal planning  Patient will continue to remain physically active for at least 150 minute per week Patient will reach out to RN Care Management Coordinator at [phone] with any resource or care coordination needs         SDOH assessments and interventions completed:  Yes  SDOH Interventions Today    Flowsheet Row Most Recent Value  SDOH Interventions   Food Insecurity Interventions Intervention Not Indicated  Housing Interventions Intervention Not Indicated  Transportation Interventions Intervention Not Indicated  Utilities Interventions Intervention Not Indicated  Financial Strain Interventions Intervention Not Indicated  Physical Activity Interventions Intervention Not Indicated  Social Connections Interventions Intervention Not Indicated  Health Literacy Interventions Intervention Not Indicated        Care Coordination Interventions:  Yes, provided  Interventions Today    Flowsheet Row Most Recent Value  Chronic Disease   Chronic disease during today's visit Diabetes  General Interventions   General Interventions Discussed/Reviewed General Interventions Discussed, General Interventions Reviewed, Labs, Durable Medical  Equipment (DME), Doctor Visits, Annual Foot Exam, Annual Eye Exam  Labs Hgb A1c every 6 months  Doctor Visits Discussed/Reviewed Doctor Visits Discussed, Doctor Visits Reviewed, Annual Wellness Visits, PCP  Durable Medical Equipment (DME) Glucomoter  [checks blood sugar daily. Blood sugar fasting was 97%. No readings less than 70 or greater than 200. Highest has been around 120.]  PCP/Specialist Visits Compliance with follow-up visit  [Dr Sherryll Burger on 11/06/22]  Exercise Interventions   Exercise Discussed/Reviewed Exercise Discussed, Exercise Reviewed, Physical Activity  Physical Activity Discussed/Reviewed Physical Activity Discussed, Physical Activity Reviewed, Types of exercise  [patient is active around his home and yard and walks daily for 30 to 40 minutes.]  Education Interventions   Education Provided Provided Education, Provided Printed Education  [printed nutrition information]  Provided Verbal Education On Nutrition, Blood Sugar Monitoring, Labs, Eye Care, Foot Care, When to see the doctor, Exercise, Medication  Labs Reviewed Hgb A1c  [04/20/22 A1C 9.9%]  Mental Health Interventions   Mental Health Discussed/Reviewed Mental Health Discussed, Mental Health Reviewed  [No concerns at this time]  Nutrition Interventions   Nutrition Discussed/Reviewed Nutrition Discussed, Nutrition Reviewed, Carbohydrate meal planning, Portion sizes, Adding fruits and vegetables, Increasing proteins, Fluid intake  [Patient has adjusted diet and is limiting sugar and simple carbs. He would like more information no starchy vs non-starchy vegetables. Mailing handout.]  Pharmacy Interventions   Pharmacy Dicussed/Reviewed Pharmacy Topics Discussed, Pharmacy Topics Reviewed, Medications and their functions  [Does not take medication for diabetes at this time. Managed with diet and exercise. He is taking all other medications regularly without any concerns.]  Safety Interventions   Safety Discussed/Reviewed Safety  Discussed, Safety Reviewed, Fall Risk  [Able to perform ADLs independently. No falls.]       Follow up plan: Follow up call scheduled for 11/14/22    Encounter Outcome:  Patient Visit Completed   Demetrios Loll, RN, BSN Care Management Coordinator [facility]  [facility] Direct Dial: [phone] Main #: [phone]

## 2022-11-01 DIAGNOSIS — Z299 Encounter for prophylactic measures, unspecified: Secondary | ICD-10-CM | POA: Diagnosis not present

## 2022-11-01 DIAGNOSIS — E1129 Type 2 diabetes mellitus with other diabetic kidney complication: Secondary | ICD-10-CM | POA: Diagnosis not present

## 2022-11-01 DIAGNOSIS — J020 Streptococcal pharyngitis: Secondary | ICD-10-CM | POA: Diagnosis not present

## 2022-11-01 DIAGNOSIS — R809 Proteinuria, unspecified: Secondary | ICD-10-CM | POA: Diagnosis not present

## 2022-11-01 DIAGNOSIS — R059 Cough, unspecified: Secondary | ICD-10-CM | POA: Diagnosis not present

## 2022-11-14 ENCOUNTER — Ambulatory Visit: Payer: Self-pay | Admitting: *Deleted

## 2022-11-14 NOTE — Patient Outreach (Signed)
Health Equity Plan Care Coordination   11/14/2022 Name: Logan Hunt MRN: [id number] DOB: Oct 03, 1955   Care Coordination Outreach Attempts:  An unsuccessful telephone outreach was attempted for a scheduled appointment today.  Follow Up Plan:  Additional outreach attempts will be made to offer the patient care coordination information and services.   Encounter Outcome:  No Answer   Care Coordination Interventions:  No, not indicated. Staff message to scheduling care guide requesting outreach and rescheduling.    Demetrios Loll, RN, BSN Care Management Coordinator [facility]  [facility] Direct Dial: [phone] Main #: [phone]

## 2022-11-20 ENCOUNTER — Telehealth: Payer: Self-pay | Admitting: *Deleted

## 2022-11-20 NOTE — Progress Notes (Signed)
Health Equity Plan Note  11/20/2022 Name: Logan Hunt MRN: [id number] DOB: [birth year]  Logan Hunt is a 67 y.o. year old male who is a primary care patient of Kirstie Peri, MD and is actively engaged with the care management team. I reached out to [name] by phone today to assist with re-scheduling a follow up visit with the RN Case Manager  Follow up plan: Unsuccessful telephone outreach attempt made. A HIPAA compliant phone message was left for the patient providing contact information and requesting a return call.   [facility]  Care Coordination Care Guide  Direct Dial: [phone]

## 2022-11-27 NOTE — Progress Notes (Signed)
Health Equity Plan  Note  11/27/2022 Name: ZAMAURI SHERTZER MRN: [id number] DOB: [birth year]  Billey Chang Winkles is a 67 y.o. year old male who is a primary care patient of Kirstie Peri, MD and is actively engaged with the care management team. I reached out to [name] by phone today to assist with re-scheduling a follow up visit with the RN Case Manager  Follow up plan: Telephone appointment with care management team member scheduled for:12/18/22  [facility] Coordination Care Guide  Direct Dial: [phone]

## 2022-11-30 DIAGNOSIS — Z23 Encounter for immunization: Secondary | ICD-10-CM | POA: Diagnosis not present

## 2022-11-30 DIAGNOSIS — I779 Disorder of arteries and arterioles, unspecified: Secondary | ICD-10-CM | POA: Diagnosis not present

## 2022-11-30 DIAGNOSIS — I10 Essential (primary) hypertension: Secondary | ICD-10-CM | POA: Diagnosis not present

## 2022-11-30 DIAGNOSIS — Z1211 Encounter for screening for malignant neoplasm of colon: Secondary | ICD-10-CM | POA: Diagnosis not present

## 2022-11-30 DIAGNOSIS — Z299 Encounter for prophylactic measures, unspecified: Secondary | ICD-10-CM | POA: Diagnosis not present

## 2022-11-30 DIAGNOSIS — R079 Chest pain, unspecified: Secondary | ICD-10-CM | POA: Diagnosis not present

## 2022-11-30 DIAGNOSIS — E1165 Type 2 diabetes mellitus with hyperglycemia: Secondary | ICD-10-CM | POA: Diagnosis not present

## 2022-12-05 ENCOUNTER — Encounter (HOSPITAL_COMMUNITY): Payer: Self-pay

## 2022-12-05 ENCOUNTER — Other Ambulatory Visit: Payer: Self-pay

## 2022-12-05 ENCOUNTER — Emergency Department (HOSPITAL_COMMUNITY)
Admission: EM | Admit: 2022-12-05 | Discharge: 2022-12-05 | Disposition: A | Discharge status: Home / Self Care | Payer: No Typology Code available for payment source | Attending: Emergency Medicine | Admitting: Emergency Medicine

## 2022-12-05 ENCOUNTER — Emergency Department (HOSPITAL_COMMUNITY): Payer: No Typology Code available for payment source

## 2022-12-05 DIAGNOSIS — I10 Essential (primary) hypertension: Secondary | ICD-10-CM | POA: Diagnosis not present

## 2022-12-05 DIAGNOSIS — Z7982 Long term (current) use of aspirin: Secondary | ICD-10-CM | POA: Diagnosis not present

## 2022-12-05 DIAGNOSIS — R911 Solitary pulmonary nodule: Secondary | ICD-10-CM | POA: Diagnosis not present

## 2022-12-05 DIAGNOSIS — S2231XA Fracture of one rib, right side, initial encounter for closed fracture: Secondary | ICD-10-CM | POA: Diagnosis not present

## 2022-12-05 DIAGNOSIS — S4992XA Unspecified injury of left shoulder and upper arm, initial encounter: Secondary | ICD-10-CM | POA: Diagnosis present

## 2022-12-05 DIAGNOSIS — Y92410 Unspecified street and highway as the place of occurrence of the external cause: Secondary | ICD-10-CM | POA: Diagnosis not present

## 2022-12-05 DIAGNOSIS — R0789 Other chest pain: Secondary | ICD-10-CM | POA: Diagnosis not present

## 2022-12-05 MED ORDER — IBUPROFEN 400 MG PO TABS
600.0000 mg | ORAL_TABLET | Freq: Once | ORAL | Status: AC
  Administered 2022-12-05: 600 mg via ORAL
  Filled 2022-12-05: qty 2

## 2022-12-05 MED ORDER — HYDROCODONE-ACETAMINOPHEN 5-325 MG PO TABS
1.0000 | ORAL_TABLET | Freq: Once | ORAL | Status: AC
  Administered 2022-12-05: 1 via ORAL
  Filled 2022-12-05: qty 1

## 2022-12-05 NOTE — ED Notes (Signed)
Ice pack placed on sternum/right side of chest

## 2022-12-05 NOTE — ED Notes (Signed)
ED Provider at bedside. 

## 2022-12-05 NOTE — ED Notes (Signed)
Incentive spirometer teaching with pt. Pt verbalizes understanding of use.

## 2022-12-05 NOTE — Discharge Instructions (Addendum)
Your x-ray showed a lung nodule on the left side of your lung.  You will need an outpatient CT scan which your primary care physician can arrange.  Use Tylenol and/or ibuprofen to help with pain.  Apply ice to the affected area.  Use the incentive spirometer every hour while awake to help keep your lungs from collapsing/developing pneumonia.  If you develop new or worsening pain, trouble breathing, back pain, or any other new/concerning symptoms then return to the ER or call 911.
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
## 2022-12-06 DIAGNOSIS — I10 Essential (primary) hypertension: Secondary | ICD-10-CM | POA: Diagnosis not present

## 2022-12-06 DIAGNOSIS — R911 Solitary pulmonary nodule: Secondary | ICD-10-CM | POA: Diagnosis not present

## 2022-12-06 DIAGNOSIS — Z299 Encounter for prophylactic measures, unspecified: Secondary | ICD-10-CM | POA: Diagnosis not present

## 2022-12-07 ENCOUNTER — Emergency Department (HOSPITAL_COMMUNITY)
Admission: EM | Admit: 2022-12-07 | Discharge: 2022-12-07 | Disposition: A | Discharge status: Home / Self Care | Payer: No Typology Code available for payment source | Attending: Emergency Medicine | Admitting: Emergency Medicine

## 2022-12-07 ENCOUNTER — Other Ambulatory Visit: Payer: Self-pay

## 2022-12-07 ENCOUNTER — Emergency Department (HOSPITAL_COMMUNITY): Payer: No Typology Code available for payment source

## 2022-12-07 ENCOUNTER — Encounter (HOSPITAL_COMMUNITY): Payer: Self-pay

## 2022-12-07 DIAGNOSIS — S63502A Unspecified sprain of left wrist, initial encounter: Secondary | ICD-10-CM | POA: Insufficient documentation

## 2022-12-07 DIAGNOSIS — S59912A Unspecified injury of left forearm, initial encounter: Secondary | ICD-10-CM | POA: Diagnosis not present

## 2022-12-07 DIAGNOSIS — Z7982 Long term (current) use of aspirin: Secondary | ICD-10-CM | POA: Insufficient documentation

## 2022-12-07 DIAGNOSIS — M25532 Pain in left wrist: Secondary | ICD-10-CM | POA: Diagnosis present

## 2022-12-07 DIAGNOSIS — S6992XA Unspecified injury of left wrist, hand and finger(s), initial encounter: Secondary | ICD-10-CM | POA: Diagnosis not present

## 2022-12-07 DIAGNOSIS — S4992XA Unspecified injury of left shoulder and upper arm, initial encounter: Secondary | ICD-10-CM | POA: Diagnosis not present

## 2022-12-07 DIAGNOSIS — Y92410 Unspecified street and highway as the place of occurrence of the external cause: Secondary | ICD-10-CM | POA: Diagnosis not present

## 2022-12-07 DIAGNOSIS — S59902A Unspecified injury of left elbow, initial encounter: Secondary | ICD-10-CM | POA: Diagnosis not present

## 2022-12-07 MED ORDER — MUPIROCIN 2 % EX OINT
TOPICAL_OINTMENT | Freq: Two times a day (BID) | CUTANEOUS | Status: DC
  Filled 2022-12-07: qty 22

## 2022-12-07 MED ORDER — MUPIROCIN 2 % EX OINT: 1.0000 | TOPICAL_OINTMENT | Freq: Two times a day (BID) | CUTANEOUS | 0 refills | Status: DC

## 2022-12-07 NOTE — Discharge Instructions (Signed)
Thankfully your x-ray showed no broken bones of your arm your elbow your forearm or your wrist.  This is probably just sprained, continue to use the topical antibiotics, I will prescribe a medicine called mupirocin which is good for these open wounds, see your doctor in 1 week for recheck, this should gradually get better
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
## 2022-12-14 DIAGNOSIS — R918 Other nonspecific abnormal finding of lung field: Secondary | ICD-10-CM | POA: Diagnosis not present

## 2022-12-14 DIAGNOSIS — R911 Solitary pulmonary nodule: Secondary | ICD-10-CM | POA: Diagnosis not present

## 2022-12-14 DIAGNOSIS — I700 Atherosclerosis of aorta: Secondary | ICD-10-CM | POA: Diagnosis not present

## 2022-12-15 DIAGNOSIS — M542 Cervicalgia: Secondary | ICD-10-CM | POA: Diagnosis not present

## 2022-12-15 DIAGNOSIS — Z6841 Body Mass Index (BMI) 40.0 and over, adult: Secondary | ICD-10-CM | POA: Diagnosis not present

## 2022-12-18 ENCOUNTER — Encounter: Payer: Self-pay | Admitting: *Deleted

## 2022-12-19 DIAGNOSIS — Z299 Encounter for prophylactic measures, unspecified: Secondary | ICD-10-CM | POA: Diagnosis not present

## 2022-12-19 DIAGNOSIS — S51012D Laceration without foreign body of left elbow, subsequent encounter: Secondary | ICD-10-CM | POA: Diagnosis not present

## 2022-12-19 DIAGNOSIS — E1169 Type 2 diabetes mellitus with other specified complication: Secondary | ICD-10-CM | POA: Diagnosis not present

## 2022-12-19 DIAGNOSIS — I10 Essential (primary) hypertension: Secondary | ICD-10-CM | POA: Diagnosis not present

## 2022-12-19 DIAGNOSIS — R52 Pain, unspecified: Secondary | ICD-10-CM | POA: Diagnosis not present

## 2022-12-19 DIAGNOSIS — I779 Disorder of arteries and arterioles, unspecified: Secondary | ICD-10-CM | POA: Diagnosis not present

## 2022-12-27 ENCOUNTER — Encounter: Payer: Self-pay | Admitting: *Deleted

## 2022-12-27 ENCOUNTER — Ambulatory Visit: Payer: Self-pay | Admitting: *Deleted

## 2023-02-20 DIAGNOSIS — L570 Actinic keratosis: Secondary | ICD-10-CM | POA: Diagnosis not present

## 2023-02-20 DIAGNOSIS — L719 Rosacea, unspecified: Secondary | ICD-10-CM | POA: Diagnosis not present

## 2023-02-20 DIAGNOSIS — Z85828 Personal history of other malignant neoplasm of skin: Secondary | ICD-10-CM | POA: Diagnosis not present

## 2023-03-05 ENCOUNTER — Ambulatory Visit: Payer: Medicare Other | Admitting: Internal Medicine

## 2023-03-14 DIAGNOSIS — E1169 Type 2 diabetes mellitus with other specified complication: Secondary | ICD-10-CM | POA: Diagnosis not present

## 2023-03-14 DIAGNOSIS — Z299 Encounter for prophylactic measures, unspecified: Secondary | ICD-10-CM | POA: Diagnosis not present

## 2023-03-14 DIAGNOSIS — I10 Essential (primary) hypertension: Secondary | ICD-10-CM | POA: Diagnosis not present

## 2023-03-14 DIAGNOSIS — I739 Peripheral vascular disease, unspecified: Secondary | ICD-10-CM | POA: Diagnosis not present

## 2023-05-01 DIAGNOSIS — I10 Essential (primary) hypertension: Secondary | ICD-10-CM | POA: Diagnosis not present

## 2023-05-01 DIAGNOSIS — E1165 Type 2 diabetes mellitus with hyperglycemia: Secondary | ICD-10-CM | POA: Diagnosis not present

## 2023-05-01 DIAGNOSIS — Z299 Encounter for prophylactic measures, unspecified: Secondary | ICD-10-CM | POA: Diagnosis not present

## 2023-05-23 DIAGNOSIS — G4733 Obstructive sleep apnea (adult) (pediatric): Secondary | ICD-10-CM | POA: Diagnosis not present

## 2023-05-23 DIAGNOSIS — Z820 Family history of epilepsy and other diseases of the nervous system: Secondary | ICD-10-CM | POA: Diagnosis not present

## 2023-05-23 DIAGNOSIS — Z1379 Encounter for other screening for genetic and chromosomal anomalies: Secondary | ICD-10-CM | POA: Diagnosis not present

## 2023-05-28 DIAGNOSIS — Z299 Encounter for prophylactic measures, unspecified: Secondary | ICD-10-CM | POA: Diagnosis not present

## 2023-05-28 DIAGNOSIS — I10 Essential (primary) hypertension: Secondary | ICD-10-CM | POA: Diagnosis not present

## 2023-05-28 DIAGNOSIS — E1169 Type 2 diabetes mellitus with other specified complication: Secondary | ICD-10-CM | POA: Diagnosis not present

## 2023-05-28 DIAGNOSIS — L03019 Cellulitis of unspecified finger: Secondary | ICD-10-CM | POA: Diagnosis not present

## 2023-06-01 ENCOUNTER — Ambulatory Visit: Payer: Medicare Other | Attending: Internal Medicine | Admitting: Internal Medicine

## 2023-06-01 ENCOUNTER — Encounter: Payer: Self-pay | Admitting: Internal Medicine

## 2023-06-01 VITALS — BP 138/62 | HR 62 | Ht 72.0 in | Wt 307.8 lb

## 2023-06-01 DIAGNOSIS — I10 Essential (primary) hypertension: Secondary | ICD-10-CM | POA: Insufficient documentation

## 2023-06-01 NOTE — Progress Notes (Signed)
 Cardiology Office Note  Date: 06/01/2023   ID: Josip, Merolla 10-23-1955, MRN [id number]  PCP:  Kirstie Peri, MD  Cardiologist:  Marjo Bicker, MD Electrophysiologist:  None   Reason for Office Visit: Chest pain   History of Present Illness: Logan Hunt is a 68 y.o. male known to have nonobstructive CAD by LHC in 2005, HTN, DM 2, HLD, history of TIA is here for follow-up visit.  Initially referred to cardiology clinic when he had 1 episode of chest pain when he walked his dog uphill.  This episode lasted for 30 to 40 minutes.  No recurrence since then.  He underwent Lexiscan that showed no evidence of ischemia.  Carotid artery Doppler showed less than 39% stenosis.  ABIs were performed that showed no evidence of PAD.  He is here today for follow-up visit.  Accompanied by wife.  He reported having mild chest discomfort when lifting heavy stuff but his kids restrict him to do it anymore.  He is physically active at baseline, does yard work and stays active throughout the day doing some work.  He does not have any symptoms of angina during these activities.  No DOE.  No lightheadedness, dizziness, syncope, palpitations, leg swelling.  Past Medical History:  Diagnosis Date   CAD (coronary artery disease)    Mild plaque 2005   Carotid artery disease (HCC)    Diabetes mellitus without complication (HCC)    DJD (degenerative joint disease)    HLD (hyperlipidemia)    Hypertension    Syncope    TIA (transient ischemic attack)     Past Surgical History:  Procedure Laterality Date   CARDIAC CATHETERIZATION     None      Current Outpatient Medications  Medication Sig Dispense Refill   albuterol (VENTOLIN HFA) 108 (90 Base) MCG/ACT inhaler Inhale into the lungs every 6 (six) hours as needed for wheezing or shortness of breath.     amoxicillin-clavulanate (AUGMENTIN) 500-125 MG tablet Take 1 tablet by mouth 2 (two) times daily.     aspirin EC 81 MG tablet Take 1  tablet (81 mg total) by mouth daily.     azelastine (ASTELIN) 0.1 % nasal spray Place 2 sprays into both nostrils as needed.     doxycycline (DORYX) 100 MG EC tablet Take 100 mg by mouth daily.     lisinopril (ZESTRIL) 40 MG tablet Take 40 mg by mouth daily.     lovastatin (MEVACOR) 40 MG tablet Take 80 mg by mouth at bedtime.      mupirocin ointment (BACTROBAN) 2 % Apply 1 Application topically 2 (two) times daily. 30 g 0   nitroGLYCERIN (NITROSTAT) 0.4 MG SL tablet Place 1 tablet (0.4 mg total) under the tongue every 5 (five) minutes x 3 doses as needed for chest pain (if no relief after 3rd dose, proceed to ED or call 911). 25 tablet 3   omeprazole (PRILOSEC) 40 MG capsule Take 1 capsule by mouth daily.     No current facility-administered medications for this visit.   Allergies:  Patient has no known allergies.   Social History: The patient  reports that he quit smoking about 16 years ago. His smoking use included cigarettes. He started smoking about 53 years ago. He has a 74 pack-year smoking history. He has never used smokeless tobacco. He reports that he does not drink alcohol and does not use drugs.   Family History: The patient's family history includes Breast cancer in his  mother; Diabetes in his father; Heart disease in his father; Lung cancer in his mother.   ROS:  Please see the history of present illness. Otherwise, complete review of systems is positive for none.  All other systems are reviewed and negative.   Physical Exam: VS:  BP 138/62   Pulse 62   Ht 6' (1.829 m)   Wt (!) 307 lb 12.8 oz (139.6 kg)   SpO2 97%   BMI 41.75 kg/m , BMI Body mass index is 41.75 kg/m.  Wt Readings from Last 3 Encounters:  06/01/23 (!) 307 lb 12.8 oz (139.6 kg)  12/07/22 (!) 313 lb 0.9 oz (142 kg)  12/05/22 (!) 314 lb (142.4 kg)    General: Patient appears comfortable at rest. HEENT: Conjunctiva and lids normal, oropharynx clear with moist mucosa. Neck: Supple, no elevated JVP or  carotid bruits, no thyromegaly. Lungs: Clear to auscultation, nonlabored breathing at rest. Cardiac: Regular rate and rhythm, no S3 or significant systolic murmur, no pericardial rub. Abdomen: Soft, nontender, no hepatomegaly, bowel sounds present, no guarding or rebound. Extremities: No pitting edema Skin: Warm and dry. Musculoskeletal: No kyphosis. Neuropsychiatric: Alert and oriented x3, affect grossly appropriate.  ECG:  NSR  Recent Labwork: No results found for requested labs within last 365 days.  No results found for: "CHOL", "TRIG", "HDL", "CHOLHDL", "VLDL", "LDLCALC", "LDLDIRECT"   Assessment and Plan:  # Chest pain: Resolved.  No interval angina.  He had mild chest discomfort while lifting heavy stuff in the past but not anymore.  He is physically active at baseline.  He does yard work and stays active all day long and does not have any symptoms of angina.  Lexiscan in 2024 showed no evidence of ischemia.  No further cardiac workup is indicated at this time.  I discussed with him that he needs to return back to cardiology clinic if he develops new symptoms of angina.  Nonobstructive CAD by LHC in 2005.  # Carotid artery stenosis: He has 1 to 39% bilateral carotid stenosis per ultrasound in 2016.  Repeat ultrasound carotid artery Doppler showed <39% stenosis.  No need to repeat the scan anymore.  Continue current cardioprotective medications.  # History of TIA: Continue aspirin 81 mg once daily, lovastatin 80 mg nightly.  Goal LDL less than 70.  # HLD: Continue lovastatin 80 mg nightly.  Goal LDL less than 70.  Follow-up with PCP.  # HTN, controlled: Continue lisinopril 40 mg once daily.  Follow-up with PCP.    Medication Adjustments/Labs and Tests Ordered: Current medicines are reviewed at length with the patient today.  Concerns regarding medicines are outlined above.   Tests Ordered: Orders Placed This Encounter  Procedures   EKG 12-Lead    Medication Changes: No  orders of the defined types were placed in this encounter.   Disposition:  Follow up PRN  Signed Audria Takeshita Verne Spurr, MD, 06/01/2023 8:58 AM    [facility] [address] [place], [place], [place] [address]

## 2023-06-01 NOTE — Patient Instructions (Signed)

## 2023-06-11 DIAGNOSIS — Z1331 Encounter for screening for depression: Secondary | ICD-10-CM | POA: Diagnosis not present

## 2023-06-11 DIAGNOSIS — E7800 Pure hypercholesterolemia, unspecified: Secondary | ICD-10-CM | POA: Diagnosis not present

## 2023-06-11 DIAGNOSIS — Z1339 Encounter for screening examination for other mental health and behavioral disorders: Secondary | ICD-10-CM | POA: Diagnosis not present

## 2023-06-11 DIAGNOSIS — Z0000 Encounter for general adult medical examination without abnormal findings: Secondary | ICD-10-CM | POA: Diagnosis not present

## 2023-06-11 DIAGNOSIS — R5383 Other fatigue: Secondary | ICD-10-CM | POA: Diagnosis not present

## 2023-06-11 DIAGNOSIS — Z7189 Other specified counseling: Secondary | ICD-10-CM | POA: Diagnosis not present

## 2023-06-11 DIAGNOSIS — Z299 Encounter for prophylactic measures, unspecified: Secondary | ICD-10-CM | POA: Diagnosis not present

## 2023-06-11 DIAGNOSIS — I10 Essential (primary) hypertension: Secondary | ICD-10-CM | POA: Diagnosis not present

## 2023-06-11 DIAGNOSIS — Z125 Encounter for screening for malignant neoplasm of prostate: Secondary | ICD-10-CM | POA: Diagnosis not present

## 2023-06-11 DIAGNOSIS — Z79899 Other long term (current) drug therapy: Secondary | ICD-10-CM | POA: Diagnosis not present

## 2023-06-13 ENCOUNTER — Encounter (INDEPENDENT_AMBULATORY_CARE_PROVIDER_SITE_OTHER): Payer: Self-pay | Admitting: *Deleted

## 2023-06-26 ENCOUNTER — Telehealth (INDEPENDENT_AMBULATORY_CARE_PROVIDER_SITE_OTHER): Payer: Self-pay | Admitting: Gastroenterology

## 2023-06-26 NOTE — Telephone Encounter (Signed)
 Who is your primary care physician: Theoplis Fix  Reasons for the colonoscopy:   Have you had a colonoscopy before?  Yes 10 years ago   Do you have family history of colon cancer? no  Previous colonoscopy with polyps removed? Yes 10 years  Do you have a history colorectal cancer?   no  Are you diabetic? If yes, Type 1 or Type 2?    Yes type 2  Do you have a prosthetic or mechanical heart valve? no  Do you have a pacemaker/defibrillator?   no  Have you had endocarditis/atrial fibrillation? no  Have you had joint replacement within the last 12 months?  no  Do you tend to be constipated or have to use laxatives? no  Do you have any history of drugs or alchohol?  no  Do you use supplemental oxygen ?  no  Have you had a stroke or heart attack within the last 6 months? no  Do you take weight loss medication?  no     Do you take any blood-thinning medications such as: (aspirin , warfarin, Plavix, Aggrenox)  yes  If yes we need the name, milligram, dosage and who is prescribing doctor aspirin  81 mg Current Outpatient Medications on File Prior to Visit  Medication Sig Dispense Refill   albuterol (VENTOLIN HFA) 108 (90 Base) MCG/ACT inhaler Inhale into the lungs every 6 (six) hours as needed for wheezing or shortness of breath. (Patient not taking: Reported on 06/26/2023)     amoxicillin-clavulanate (AUGMENTIN) 500-125 MG tablet Take 1 tablet by mouth 2 (two) times daily. (Patient not taking: Reported on 06/26/2023)     aspirin  EC 81 MG tablet Take 1 tablet (81 mg total) by mouth daily.     azelastine (ASTELIN) 0.1 % nasal spray Place 2 sprays into both nostrils as needed. (Patient not taking: Reported on 06/26/2023)     doxycycline (DORYX) 100 MG EC tablet Take 100 mg by mouth daily.     lisinopril (ZESTRIL) 40 MG tablet Take 40 mg by mouth daily.     lovastatin (MEVACOR) 40 MG tablet Take 80 mg by mouth at bedtime.      mupirocin  ointment (BACTROBAN ) 2 % Apply 1 Application topically 2  (two) times daily. (Patient not taking: Reported on 06/26/2023) 30 g 0   nitroGLYCERIN  (NITROSTAT ) 0.4 MG SL tablet Place 1 tablet (0.4 mg total) under the tongue every 5 (five) minutes x 3 doses as needed for chest pain (if no relief after 3rd dose, proceed to ED or call 911). (Patient not taking: Reported on 06/26/2023) 25 tablet 3   omeprazole (PRILOSEC) 40 MG capsule Take 1 capsule by mouth daily.     No current facility-administered medications on file prior to visit.    No Known Allergies   Pharmacy: [facility]  Primary Insurance Name: Medicare  Best number where you can be reached: [phone]

## 2023-07-03 ENCOUNTER — Encounter (INDEPENDENT_AMBULATORY_CARE_PROVIDER_SITE_OTHER): Payer: Self-pay | Admitting: *Deleted

## 2023-07-03 MED ORDER — PEG 3350-KCL-NA BICARB-NACL 420 G PO SOLR: 4000.0000 mL | Freq: Once | ORAL | 0 refills | Status: AC

## 2023-07-03 NOTE — Telephone Encounter (Signed)
Ok to schedule.  Room : any   Thanks,  Vista Lawman, MD Gastroenterology and Hepatology [facility]

## 2023-07-03 NOTE — Telephone Encounter (Signed)
 Pt contacted and scheduled. Instructions mailed to pt. Prep sent to pharmacy.

## 2023-07-03 NOTE — Telephone Encounter (Signed)
 Referral completed, TCS apt letter sent to PCP

## 2023-07-03 NOTE — Addendum Note (Signed)
 Addended by: Ameilia Rattan on: 07/03/2023 02:00 PM   Modules accepted: Orders

## 2023-07-12 DIAGNOSIS — I739 Peripheral vascular disease, unspecified: Secondary | ICD-10-CM | POA: Diagnosis not present

## 2023-07-12 DIAGNOSIS — I10 Essential (primary) hypertension: Secondary | ICD-10-CM | POA: Diagnosis not present

## 2023-07-12 DIAGNOSIS — E1169 Type 2 diabetes mellitus with other specified complication: Secondary | ICD-10-CM | POA: Diagnosis not present

## 2023-07-12 DIAGNOSIS — Z299 Encounter for prophylactic measures, unspecified: Secondary | ICD-10-CM | POA: Diagnosis not present

## 2023-07-27 ENCOUNTER — Encounter (HOSPITAL_COMMUNITY)
Admission: RE | Disposition: A | Discharge status: Home / Self Care | Payer: Self-pay | Source: Ambulatory Visit | Attending: Gastroenterology

## 2023-07-27 ENCOUNTER — Other Ambulatory Visit: Payer: Self-pay

## 2023-07-27 ENCOUNTER — Ambulatory Visit (HOSPITAL_COMMUNITY): Admitting: Anesthesiology

## 2023-07-27 ENCOUNTER — Encounter (HOSPITAL_COMMUNITY): Payer: Self-pay | Admitting: Gastroenterology

## 2023-07-27 ENCOUNTER — Ambulatory Visit (HOSPITAL_COMMUNITY)
Admission: RE | Admit: 2023-07-27 | Discharge: 2023-07-27 | Disposition: A | Discharge status: Home / Self Care | Source: Ambulatory Visit | Attending: Gastroenterology | Admitting: Gastroenterology

## 2023-07-27 DIAGNOSIS — I2510 Atherosclerotic heart disease of native coronary artery without angina pectoris: Secondary | ICD-10-CM | POA: Diagnosis not present

## 2023-07-27 DIAGNOSIS — Z7985 Long-term (current) use of injectable non-insulin antidiabetic drugs: Secondary | ICD-10-CM | POA: Diagnosis not present

## 2023-07-27 DIAGNOSIS — E119 Type 2 diabetes mellitus without complications: Secondary | ICD-10-CM | POA: Insufficient documentation

## 2023-07-27 DIAGNOSIS — I10 Essential (primary) hypertension: Secondary | ICD-10-CM | POA: Insufficient documentation

## 2023-07-27 DIAGNOSIS — Z1211 Encounter for screening for malignant neoplasm of colon: Secondary | ICD-10-CM | POA: Insufficient documentation

## 2023-07-27 DIAGNOSIS — K5730 Diverticulosis of large intestine without perforation or abscess without bleeding: Secondary | ICD-10-CM

## 2023-07-27 DIAGNOSIS — K635 Polyp of colon: Secondary | ICD-10-CM | POA: Diagnosis not present

## 2023-07-27 DIAGNOSIS — K648 Other hemorrhoids: Secondary | ICD-10-CM | POA: Insufficient documentation

## 2023-07-27 DIAGNOSIS — D123 Benign neoplasm of transverse colon: Secondary | ICD-10-CM

## 2023-07-27 DIAGNOSIS — K644 Residual hemorrhoidal skin tags: Secondary | ICD-10-CM | POA: Insufficient documentation

## 2023-07-27 DIAGNOSIS — K3533 Acute appendicitis with perforation and localized peritonitis, with abscess: Secondary | ICD-10-CM

## 2023-07-27 DIAGNOSIS — K388 Other specified diseases of appendix: Secondary | ICD-10-CM | POA: Diagnosis not present

## 2023-07-27 HISTORY — PX: COLONOSCOPY: SHX5424

## 2023-07-27 LAB — HM COLONOSCOPY

## 2023-07-27 LAB — GLUCOSE, CAPILLARY: Glucose-Capillary: 131 mg/dL — ABNORMAL HIGH (ref 70–99)

## 2023-07-27 SURGERY — COLONOSCOPY
Anesthesia: General

## 2023-07-27 MED ORDER — PROPOFOL 10 MG/ML IV BOLUS
INTRAVENOUS | Status: DC | PRN
  Administered 2023-07-27: 100 mg via INTRAVENOUS
  Administered 2023-07-27: 30 mg via INTRAVENOUS
  Administered 2023-07-27: 125 ug/kg/min via INTRAVENOUS

## 2023-07-27 MED ORDER — LACTATED RINGERS IV SOLN: INTRAVENOUS | Status: DC

## 2023-07-27 MED ORDER — LIDOCAINE 2% (20 MG/ML) 5 ML SYRINGE
INTRAMUSCULAR | Status: DC | PRN
  Administered 2023-07-27: 60 mg via INTRAVENOUS

## 2023-07-27 NOTE — Anesthesia Preprocedure Evaluation (Signed)
 Anesthesia Evaluation  Patient identified by MRN, date of birth, ID band Patient awake    Reviewed: Allergy & Precautions, H&P , NPO status , Patient's Chart, lab work & pertinent test results, reviewed documented beta blocker date and time   Airway Mallampati: II  TM Distance: >3 FB Neck ROM: full    Dental no notable dental hx.    Pulmonary neg pulmonary ROS, former smoker   Pulmonary exam normal breath sounds clear to auscultation       Cardiovascular Exercise Tolerance: Good hypertension, + CAD   Rhythm:regular Rate:Normal     Neuro/Psych TIA negative psych ROS   GI/Hepatic negative GI ROS, Neg liver ROS,,,  Endo/Other  diabetes    Renal/GU negative Renal ROS  negative genitourinary   Musculoskeletal   Abdominal   Peds  Hematology negative hematology ROS (+)   Anesthesia Other Findings   Reproductive/Obstetrics negative OB ROS                             Anesthesia Physical Anesthesia Plan  ASA: 3  Anesthesia Plan: General   Post-op Pain Management:    Induction:   PONV Risk Score and Plan: Propofol infusion  Airway Management Planned:   Additional Equipment:   Intra-op Plan:   Post-operative Plan:   Informed Consent: I have reviewed the patients History and Physical, chart, labs and discussed the procedure including the risks, benefits and alternatives for the proposed anesthesia with the patient or authorized representative who has indicated his/her understanding and acceptance.     Dental Advisory Given  Plan Discussed with: CRNA  Anesthesia Plan Comments:        Anesthesia Quick Evaluation

## 2023-07-27 NOTE — H&P (Signed)
 Primary Care Physician:  Theoplis Fix, MD Primary Gastroenterologist:  Dr. Alita Irwin  Pre-Procedure History & Physical: HPI:  Logan Hunt is a 68 y.o. male is here for a colonoscopy for colon cancer screening purposes.  Patient denies any family history of colorectal cancer.  No melena or hematochezia.  No abdominal pain or unintentional weight loss.  No change in bowel habits.  Overall feels well from a GI standpoint.  Past Medical History:  Diagnosis Date   CAD (coronary artery disease)    Mild plaque 2005   Carotid artery disease (HCC)    Diabetes mellitus without complication (HCC)    DJD (degenerative joint disease)    HLD (hyperlipidemia)    Hypertension    Syncope    TIA (transient ischemic attack)     Past Surgical History:  Procedure Laterality Date   CARDIAC CATHETERIZATION     None      Prior to Admission medications   Medication Sig Start Date End Date Taking? Authorizing Provider  amoxicillin-clavulanate (AUGMENTIN) 500-125 MG tablet Take 1 tablet by mouth 2 (two) times daily. 05/28/23  Yes [provider]  aspirin  EC 81 MG tablet Take 1 tablet (81 mg total) by mouth daily. 06/05/12  Yes Serpe, Rainey Burden, PA-C  azelastine (ASTELIN) 0.1 % nasal spray Place 2 sprays into both nostrils as needed. 04/20/22  Yes [provider]  doxycycline (DORYX) 100 MG EC tablet Take 100 mg by mouth daily.   Yes [provider]  lisinopril (ZESTRIL) 40 MG tablet Take 40 mg by mouth daily.   Yes [provider]  lovastatin (MEVACOR) 40 MG tablet Take 80 mg by mouth at bedtime.    Yes [provider]  mupirocin  ointment (BACTROBAN ) 2 % Apply 1 Application topically 2 (two) times daily. 12/07/22  Yes Early Glisson, MD  omeprazole (PRILOSEC) 40 MG capsule Take 1 capsule by mouth daily. 07/14/15  Yes [provider]  Semaglutide,0.25 or 0.5MG /DOS, (OZEMPIC, 0.25 OR 0.5 MG/DOSE,) 2 MG/1.5ML SOPN Inject into the skin.   Yes [provider]  albuterol (VENTOLIN HFA) 108 (90 Base) MCG/ACT inhaler Inhale into the lungs every 6 (six) hours as needed for wheezing or shortness of breath. Patient not taking: Reported on 06/26/2023    [provider]  nitroGLYCERIN  (NITROSTAT ) 0.4 MG SL tablet Place 1 tablet (0.4 mg total) under the tongue every 5 (five) minutes x 3 doses as needed for chest pain (if no relief after 3rd dose, proceed to ED or call 911). Patient not taking: Reported on 06/26/2023 06/06/22   Mallipeddi, Kennyth Pean, MD    Allergies as of 07/03/2023   (No Known Allergies)    Family History  Problem Relation Age of Onset   Heart disease Father    Diabetes Father    Breast cancer Mother    Lung cancer Mother     Social History   Socioeconomic History   Marital status: Married    Spouse name: Not on file   Number of children: Not on file   Years of education: Not on file   Highest education level: Not on file  Occupational History   Occupation: Disabled  Tobacco Use   Smoking status: Former    Current packs/day: 0.00    Average packs/day: 2.0 packs/day for 37.0 years (74.0 ttl pk-yrs)    Types: Cigarettes    Start date: 04/02/1970    Quit date: 02/21/2007    Years since quitting: 16.4   Smokeless tobacco: Never  Vaping Use   Vaping status: Never Used  Substance and Sexual Activity   Alcohol  use: No    Alcohol /week: 0.0 standard drinks of alcohol    Drug use: No   Sexual activity: Not on file  Other Topics Concern   Not on file  Social History Narrative   Regular exercise: Yes   Social Drivers of Health   Financial Resource Strain: Low Risk  (12/27/2022)   Overall Financial Resource Strain (CARDIA)    Difficulty of Paying Living Expenses: Not very hard  Food Insecurity: No Food Insecurity (10/27/2022)   Hunger Vital Sign    Worried About Running Out of Food in the Last Year: Never true    Ran Out of Food in the Last Year: Never true  Transportation Needs: No Transportation Needs  (12/27/2022)   PRAPARE - Administrator, Civil Service (Medical): No    Lack of Transportation (Non-Medical): No  Physical Activity: Sufficiently Active (10/27/2022)   Exercise Vital Sign    Days of Exercise per Week: 7 days    Minutes of Exercise per Session: 40 min  Stress: Not on file  Social Connections: Socially Integrated (10/27/2022)   Social Connection and Isolation Panel [NHANES]    Frequency of Communication with Friends and Family: More than three times a week    Frequency of Social Gatherings with Friends and Family: More than three times a week    Attends Religious Services: 1 to 4 times per year    Active Member of Clubs or Organizations: No    Attends Engineer, structural: More than 4 times per year    Marital Status: Married  Catering manager Violence: Unknown (08/10/2021)   Received from [employer], [facility]   HITS    Physically Hurt: Not on file    Insult or Talk Down To: Not on file    Threaten Physical Harm: Not on file    Scream or Curse: Not on file    Review of Systems: See HPI, otherwise negative ROS  Physical Exam: Vital signs in last 24 hours: Temp:  [98.2 F (36.8 C)] 98.2 F (36.8 C) (06/06 0805) Pulse Rate:  [71] 71 (06/06 0805) Resp:  [22] 22 (06/06 0805) BP: (122)/(73) 122/73 (06/06 0807) SpO2:  [96 %] 96 % (06/06 0805) Weight:  [151 kg] 151 kg (06/06 0805)   General:   Alert,  Well-developed, well-nourished, pleasant and cooperative in NAD Head:  Normocephalic and atraumatic. Eyes:  Sclera clear, no icterus.   Conjunctiva pink. Ears:  Normal auditory acuity. Nose:  No deformity, discharge,  or lesions. Msk:  Symmetrical without gross deformities. Normal posture. Extremities:  Without clubbing or edema. Neurologic:  Alert and  oriented x4;  grossly normal neurologically. Skin:  Intact without significant lesions or rashes. Psych:  Alert and cooperative. Normal mood and affect.  Impression/Plan: Logan Hunt  is here for a colonoscopy to be performed for colon cancer screening purposes.  The risks of the procedure including infection, bleed, or perforation as well as benefits, limitations, alternatives and imponderables have been reviewed with the patient. Questions have been answered. All parties agreeable.

## 2023-07-27 NOTE — Anesthesia Postprocedure Evaluation (Signed)
 Anesthesia Post Note  Patient: Logan Hunt  Procedure(s) Performed: COLONOSCOPY  Patient location during evaluation: Phase II Anesthesia Type: General Level of consciousness: awake Pain management: pain level controlled Vital Signs Assessment: post-procedure vital signs reviewed and stable Respiratory status: spontaneous breathing and respiratory function stable Cardiovascular status: blood pressure returned to baseline and stable Postop Assessment: no headache and no apparent nausea or vomiting Anesthetic complications: no Comments: Late entry   No notable events documented.   Last Vitals:  Vitals:   07/27/23 1021 07/27/23 1025  BP: 96/60 114/62  Pulse: (!) 56 69  Resp: 13 16  Temp: 36.4 C   SpO2: 93% 96%    Last Pain:  Vitals:   07/27/23 1028  TempSrc:   PainSc: 0-No pain                 Coretha Dew

## 2023-07-27 NOTE — Transfer of Care (Addendum)
 Immediate Anesthesia Transfer of Care Note  Patient: Logan Hunt  Procedure(s) Performed: COLONOSCOPY  Patient Location: Endoscopy Unit  Anesthesia Type:General  Level of Consciousness: drowsy and patient cooperative  Airway & Oxygen  Therapy: Patient Spontanous Breathing  Post-op Assessment: Report given to RN and Post -op Vital signs reviewed and stable  Post vital signs: Reviewed and stable  Last Vitals:  Vitals Value Taken Time  BP 96/60 07/27/23   1021  Temp 36.4 07/27/23   1021  Pulse 56 07/27/23   1021  Resp 13 07/27/23   1021  SpO2 93% 07/27/23   1021    Last Pain:  Vitals:   07/27/23 0805  TempSrc: Oral  PainSc: 0-No pain      Patients Stated Pain Goal: 7 (07/27/23 0805)  Complications: No notable events documented.

## 2023-07-27 NOTE — Discharge Instructions (Signed)

## 2023-07-27 NOTE — Op Note (Addendum)
 [facility] Patient Name: Logan Hunt Procedure Date: 07/27/2023 9:28 AM MRN: [id number] Date of Birth: February 13, 1956 Attending MD: Terril Fetters , MD, [id number] CSN: [id number] Age: 68 Admit Type: Outpatient Procedure:                Colonoscopy Indications:              Screening for colorectal malignant neoplasm Providers:                Terril Fetters, MD, Crystal Page, Annell Barrow Referring MD:              Medicines:                Monitored Anesthesia Care Complications:            No immediate complications. Estimated Blood Loss:     Estimated blood loss: none. Procedure:                Pre-Anesthesia Assessment:                           - Prior to the procedure, a History and Physical                            was performed, and patient medications and                            allergies were reviewed. The patient's tolerance of                            previous anesthesia was also reviewed. The risks                            and benefits of the procedure and the sedation                            options and risks were discussed with the patient.                            All questions were answered, and informed consent                            was obtained. Prior Anticoagulants: The patient has                            taken no anticoagulant or antiplatelet agents. ASA                            Grade Assessment: II - A patient with mild systemic                            disease. After reviewing the risks and benefits,                            the patient was deemed in satisfactory condition to  undergo the procedure.                           After obtaining informed consent, the colonoscope                            was passed under direct vision. Throughout the                            procedure, the patient's blood pressure, pulse, and                            oxygen  saturations were monitored continuously. The                             [id number]) scope was introduced through the                            anus and advanced to the the cecum, identified by                            appendiceal orifice and ileocecal valve. The                            colonoscopy was performed without difficulty. The                            patient tolerated the procedure well. The quality                            of the bowel preparation was evaluated using the                            BBPS [facility] Bowel Preparation Scale) with scores                            of: Right Colon = 3, Transverse Colon = 3 and Left                            Colon = 3 (entire mucosa seen well with no residual                            staining, small fragments of stool or opaque                            liquid). The total BBPS score equals 9. The                            ileocecal valve, appendiceal orifice, and rectum                            were photographed. Scope In: 9:43:48 AM Scope Out: 10:15:28 AM Scope Withdrawal Time: 0 hours 28 minutes 19 seconds  Total  Procedure Duration: 0 hours 31 minutes 40 seconds  Findings:      The perianal and digital rectal examinations were normal.      One 7 mm nodule was found at the appendiceal orifice.      Two sessile polyps were found in the transverse colon. The polyps were 4       to 6 mm in size. These polyps were removed with a cold snare. Resection       and retrieval were complete.      Scattered small-mouthed diverticula were found in the left colon.      Non-bleeding external and internal hemorrhoids were found during       retroflexion. The hemorrhoids were small. Impression:               - Protruding lesion through appendiceal orifice,                            with mucoprulent discharge spontaneously seen ; no                            intervention performed. (SEE PICTURES)                           - Two 4 to 6 mm polyps in the transverse colon,                             removed with a cold snare. Resected and retrieved.                           - Diverticulosis in the left colon.                           - Non-bleeding external and internal hemorrhoids. Moderate Sedation:      Per Anesthesia Care Recommendation:           - Patient has a contact number available for                            emergencies. The signs and symptoms of potential                            delayed complications were discussed with the                            patient. Return to normal activities tomorrow.                            Written discharge instructions were provided to the                            patient.                           - Resume previous diet.                           - Continue present medications.                           -  Await pathology results.                           - Repeat colonoscopy in 7-10 years for                            surveillance;if medically fit .                           - Refer to a surgeon at appointment to be scheduled.                           -Obtain CT Abdomen and pelvis                           -Surgical referral for possible appendectomy Procedure Code(s):        --- Professional ---                           [phone], Colonoscopy, flexible; with removal of                            tumor(s), polyp(s), or other lesion(s) by snare                            technique Diagnosis Code(s):        --- Professional ---                           Z12.11, Encounter for screening for malignant                            neoplasm of colon                           K63.89, Other specified diseases of intestine                           D12.3, Benign neoplasm of transverse colon (hepatic                            flexure or splenic flexure)                           K64.8, Other hemorrhoids                           K57.30, Diverticulosis of large intestine without                             perforation or abscess without bleeding CPT copyright 2022 American Medical Association. All rights reserved. The codes documented in this report are preliminary and upon coder review may  be revised to meet current compliance requirements. Terril Fetters, MD Terril Fetters, MD 07/27/2023 10:23:56 AM This report has been signed electronically. Number of Addenda: 0

## 2023-07-30 ENCOUNTER — Encounter (HOSPITAL_COMMUNITY): Payer: Self-pay | Admitting: Gastroenterology

## 2023-07-30 ENCOUNTER — Telehealth: Payer: Self-pay | Admitting: *Deleted

## 2023-07-30 ENCOUNTER — Encounter (INDEPENDENT_AMBULATORY_CARE_PROVIDER_SITE_OTHER): Payer: Self-pay | Admitting: *Deleted

## 2023-07-30 LAB — SURGICAL PATHOLOGY

## 2023-07-30 NOTE — Telephone Encounter (Signed)
-----   Message from Hargis Lias sent at 07/27/2023 10:27 AM EDT ----- Regarding: CT Hi [name],   Can you please schedule a CT Abdomen Pelvis ( not stat but urgent ) ? Dx: Appendiceal lesion   Thanks,  Muhammad Faizan Ahmed, MD Gastroenterology and Hepatology [facility]

## 2023-07-30 NOTE — Telephone Encounter (Signed)
 Scheduled for 6/12, arrival 1:00pm to start drinking oral contrast, liquids only 4 hrs prior to test  Called pt and he is aware of appt details.

## 2023-08-02 ENCOUNTER — Ambulatory Visit (HOSPITAL_COMMUNITY)
Admission: RE | Admit: 2023-08-02 | Discharge: 2023-08-02 | Disposition: A | Discharge status: Home / Self Care | Source: Ambulatory Visit | Attending: Gastroenterology | Admitting: Gastroenterology

## 2023-08-02 DIAGNOSIS — K760 Fatty (change of) liver, not elsewhere classified: Secondary | ICD-10-CM | POA: Diagnosis not present

## 2023-08-02 DIAGNOSIS — K3533 Acute appendicitis with perforation and localized peritonitis, with abscess: Secondary | ICD-10-CM | POA: Diagnosis not present

## 2023-08-02 DIAGNOSIS — K5730 Diverticulosis of large intestine without perforation or abscess without bleeding: Secondary | ICD-10-CM | POA: Diagnosis not present

## 2023-08-02 DIAGNOSIS — K429 Umbilical hernia without obstruction or gangrene: Secondary | ICD-10-CM | POA: Diagnosis not present

## 2023-08-02 MED ORDER — IOHEXOL 300 MG/ML  SOLN
100.0000 mL | Freq: Once | INTRAMUSCULAR | Status: AC | PRN
  Administered 2023-08-02: 100 mL via INTRAVENOUS

## 2023-08-02 MED ORDER — IOHEXOL 9 MG/ML PO SOLN: 500.0000 mL | ORAL | Status: AC

## 2023-08-06 ENCOUNTER — Ambulatory Visit (INDEPENDENT_AMBULATORY_CARE_PROVIDER_SITE_OTHER): Payer: Self-pay | Admitting: Gastroenterology

## 2023-08-06 LAB — POCT I-STAT CREATININE: Creatinine, Ser: 1 mg/dL (ref 0.61–1.24)

## 2023-08-08 DIAGNOSIS — Z1379 Encounter for other screening for genetic and chromosomal anomalies: Secondary | ICD-10-CM | POA: Diagnosis not present

## 2023-08-08 DIAGNOSIS — G4733 Obstructive sleep apnea (adult) (pediatric): Secondary | ICD-10-CM | POA: Diagnosis not present

## 2023-08-08 DIAGNOSIS — Z820 Family history of epilepsy and other diseases of the nervous system: Secondary | ICD-10-CM | POA: Diagnosis not present

## 2023-08-14 DIAGNOSIS — L719 Rosacea, unspecified: Secondary | ICD-10-CM | POA: Diagnosis not present

## 2023-08-14 DIAGNOSIS — Z85828 Personal history of other malignant neoplasm of skin: Secondary | ICD-10-CM | POA: Diagnosis not present

## 2023-08-14 DIAGNOSIS — L570 Actinic keratosis: Secondary | ICD-10-CM | POA: Diagnosis not present

## 2023-08-14 NOTE — Progress Notes (Signed)
Patient result letter mailed procedure note and pathology result faxed to PCP

## 2023-08-21 ENCOUNTER — Encounter: Payer: Self-pay | Admitting: Surgery

## 2023-08-21 ENCOUNTER — Ambulatory Visit (INDEPENDENT_AMBULATORY_CARE_PROVIDER_SITE_OTHER): Admitting: Surgery

## 2023-08-21 VITALS — BP 155/80 | HR 57 | Temp 97.7°F | Resp 18 | Ht 72.0 in | Wt 301.0 lb

## 2023-08-21 DIAGNOSIS — D121 Benign neoplasm of appendix: Secondary | ICD-10-CM

## 2023-08-24 NOTE — H&P (Signed)
 [facility] History and Physical  Reason for Referral: Appendiceal lesion Referring Physician: Dr. Cinderella  Chief Complaint   New Patient (Initial Visit)     Logan Hunt is a 68 y.o. male.  HPI: Patient presents for evaluation of an appendiceal lesion.  He underwent a screening colonoscopy with Dr. Cinderella on 6/6, and he was noted to have a 7 mm nodule at the appendiceal orifice with mucopurulent discharge.  No biopsy was performed at the time of the colonoscopy.  He was referred to me to discuss appendectomy.  He denies no abdominal pain, nausea, or vomiting.  He is having regular bowel movements.  He has a past medical history significant for hypertension, hyperlipidemia, GERD, obstructive sleep apnea, and a heart attack in the 80s.  He does follow with cardiology, who recently performed a stress test, and everything was noted to be normal.  He takes an 81 mg aspirin  daily.  He denies history of abdominal surgeries.  He denies use of tobacco products, alcohol , and illicit drugs.  Past Medical History:  Diagnosis Date   CAD (coronary artery disease)    Mild plaque 2005   Carotid artery disease (HCC)    Diabetes mellitus without complication (HCC)    DJD (degenerative joint disease)    HLD (hyperlipidemia)    Hypertension    Syncope    TIA (transient ischemic attack)     Past Surgical History:  Procedure Laterality Date   CARDIAC CATHETERIZATION     COLONOSCOPY N/A 07/27/2023   Procedure: COLONOSCOPY;  Surgeon: Cinderella Deatrice FALCON, MD;  Location: [facility];  Service: Endoscopy;  Laterality: N/A;  9:15AM;ASA 1-2   None      Family History  Problem Relation Age of Onset   Heart disease Father    Diabetes Father    Breast cancer Mother    Lung cancer Mother     Social History   Tobacco Use   Smoking status: Former    Current packs/day: 0.00    Average packs/day: 2.0 packs/day for 37.0 years (74.0 ttl pk-yrs)    Types: Cigarettes    Start date:  04/02/1970    Quit date: 02/21/2007    Years since quitting: 16.5   Smokeless tobacco: Never  Vaping Use   Vaping status: Never Used  Substance Use Topics   Alcohol  use: No    Alcohol /week: 0.0 standard drinks of alcohol    Drug use: No    Medications: I have reviewed the patient's current medications. Allergies as of 08/21/2023   No Known Allergies      Medication List        Accurate as of August 21, 2023 11:59 PM. If you have any questions, ask your nurse or doctor.          STOP taking these medications    amoxicillin-clavulanate 500-125 MG tablet Commonly known as: AUGMENTIN Stopped by: Hanley Woerner A Delories Mauri   mupirocin  ointment 2 % Commonly known as: BACTROBAN  Stopped by: Ardenia Stiner A Katonya Blecher       TAKE these medications    albuterol 108 (90 Base) MCG/ACT inhaler Commonly known as: VENTOLIN HFA Inhale into the lungs every 6 (six) hours as needed for wheezing or shortness of breath.   aspirin  EC 81 MG tablet Take 1 tablet (81 mg total) by mouth daily.   azelastine 0.1 % nasal spray Commonly known as: ASTELIN Place 2 sprays into both nostrils as needed.   doxycycline 100 MG EC tablet Commonly known as: DORYX Take  100 mg by mouth daily.   lisinopril 40 MG tablet Commonly known as: ZESTRIL Take 40 mg by mouth daily.   lovastatin 40 MG tablet Commonly known as: MEVACOR Take 80 mg by mouth at bedtime.   nitroGLYCERIN  0.4 MG SL tablet Commonly known as: NITROSTAT  Place 1 tablet (0.4 mg total) under the tongue every 5 (five) minutes x 3 doses as needed for chest pain (if no relief after 3rd dose, proceed to ED or call 911).   omeprazole 40 MG capsule Commonly known as: PRILOSEC Take 1 capsule by mouth daily.   Ozempic (0.25 or 0.5 MG/DOSE) 2 MG/1.5ML Sopn Generic drug: Semaglutide(0.25 or 0.5MG /DOS) Inject into the skin.         ROS:  Constitutional: negative for chills, fatigue, and fevers Eyes: negative for visual disturbance and  pain Ears, nose, mouth, throat, and face: negative for ear drainage, sore throat, and sinus problems Respiratory: negative for cough, wheezing, and shortness of breath Cardiovascular: negative for chest pain and palpitations Gastrointestinal: negative for abdominal pain, nausea, reflux symptoms, and vomiting Genitourinary:negative for dysuria and frequency Integument/breast: negative for dryness and rash Hematologic/lymphatic: negative for bleeding and lymphadenopathy Musculoskeletal:negative for back pain and neck pain Neurological: negative for dizziness and tremors Endocrine: negative for temperature intolerance  Blood pressure (!) 155/80, pulse (!) 57, temperature 97.7 F (36.5 C), temperature source Oral, resp. rate 18, height 6' (1.829 m), weight (!) 301 lb (136.5 kg), SpO2 95%. Physical Exam Vitals reviewed.  Constitutional:      Appearance: Normal appearance.  HENT:     Head: Normocephalic and atraumatic.  Eyes:     Extraocular Movements: Extraocular movements intact.     Pupils: Pupils are equal, round, and reactive to light.  Cardiovascular:     Rate and Rhythm: Normal rate and regular rhythm.  Pulmonary:     Effort: Pulmonary effort is normal.     Breath sounds: Normal breath sounds.  Abdominal:     Comments: Abdomen soft, nondistended, no percussion tenderness, nontender to palpation; no rigidity, guarding, rebound tenderness  Musculoskeletal:        General: Normal range of motion.     Cervical back: Normal range of motion.  Skin:    General: Skin is warm and dry.  Neurological:     General: No focal deficit present.     Mental Status: He is alert and oriented to person, place, and time.  Psychiatric:        Mood and Affect: Mood normal.        Behavior: Behavior normal.     Results: CT abdomen and pelvis (08/02/2023): IMPRESSION: Mild enlargement of base of appendix, without inflammatory changes; small proximal appendiceal neoplasm cannot be excluded. No  evidence of mucocele.   Colonic diverticulosis, without radiographic evidence of diverticulitis.   Mild hepatic steatosis.   Small fat-containing umbilical hernia.   Assessment & Plan:  Logan Hunt is a 68 y.o. male who presents to discuss appendectomy.  -We discussed the results of his colonoscopy and CT scan.  We discussed that the CT is showing mild enlargement at the base of the appendix, consistent with the finding of a nodule at the base of the appendix at the time of colonoscopy.  Since we have no tissue diagnosis, I would recommend proceeding with appendectomy.  We discussed that he may need further procedures pending the results of his final biopsy -The risk and benefits of robotic assisted laparoscopic appendectomy were discussed including but not limited to bleeding,  infection, injury to surrounding structures, need for additional procedures.  After careful consideration, Logan Hunt has decided to proceed with surgery. - Patient tentatively scheduled for surgery on 7/21 -Information provided to the patient regarding appendectomy -Discussed that if he has worsening right lower quadrant abdominal pain, nausea, vomiting, fever, and chills, he needs to present to the emergency department for evaluation  All questions were answered to the satisfaction of the patient and family.  Note: Portions of this report may have been transcribed using voice recognition software. Every effort has been made to ensure accuracy; however, inadvertent computerized transcription errors may still be present.   Dorothyann Brittle, DO [facility] [address] [place] [address], [place] [address] [phone] (office)

## 2023-08-24 NOTE — Progress Notes (Signed)
 [facility] History and Physical  Reason for Referral: Appendiceal lesion Referring Physician: Dr. Cinderella  Chief Complaint   New Patient (Initial Visit)     Logan Hunt is a 68 y.o. male.  HPI: Patient presents for evaluation of an appendiceal lesion.  He underwent a screening colonoscopy with Dr. Cinderella on 6/6, and he was noted to have a 7 mm nodule at the appendiceal orifice with mucopurulent discharge.  No biopsy was performed at the time of the colonoscopy.  He was referred to me to discuss appendectomy.  He denies no abdominal pain, nausea, or vomiting.  He is having regular bowel movements.  He has a past medical history significant for hypertension, hyperlipidemia, GERD, obstructive sleep apnea, and a heart attack in the 80s.  He does follow with cardiology, who recently performed a stress test, and everything was noted to be normal.  He takes an 81 mg aspirin  daily.  He denies history of abdominal surgeries.  He denies use of tobacco products, alcohol , and illicit drugs.  Past Medical History:  Diagnosis Date   CAD (coronary artery disease)    Mild plaque 2005   Carotid artery disease (HCC)    Diabetes mellitus without complication (HCC)    DJD (degenerative joint disease)    HLD (hyperlipidemia)    Hypertension    Syncope    TIA (transient ischemic attack)     Past Surgical History:  Procedure Laterality Date   CARDIAC CATHETERIZATION     COLONOSCOPY N/A 07/27/2023   Procedure: COLONOSCOPY;  Surgeon: Cinderella Deatrice FALCON, MD;  Location: [facility];  Service: Endoscopy;  Laterality: N/A;  9:15AM;ASA 1-2   None      Family History  Problem Relation Age of Onset   Heart disease Father    Diabetes Father    Breast cancer Mother    Lung cancer Mother     Social History   Tobacco Use   Smoking status: Former    Current packs/day: 0.00    Average packs/day: 2.0 packs/day for 37.0 years (74.0 ttl pk-yrs)    Types: Cigarettes    Start date:  04/02/1970    Quit date: 02/21/2007    Years since quitting: 16.5   Smokeless tobacco: Never  Vaping Use   Vaping status: Never Used  Substance Use Topics   Alcohol  use: No    Alcohol /week: 0.0 standard drinks of alcohol    Drug use: No    Medications: I have reviewed the patient's current medications. Allergies as of 08/21/2023   No Known Allergies      Medication List        Accurate as of August 21, 2023 11:59 PM. If you have any questions, ask your nurse or doctor.          STOP taking these medications    amoxicillin-clavulanate 500-125 MG tablet Commonly known as: AUGMENTIN Stopped by: Hanley Woerner A Delories Mauri   mupirocin  ointment 2 % Commonly known as: BACTROBAN  Stopped by: Ardenia Stiner A Katonya Blecher       TAKE these medications    albuterol 108 (90 Base) MCG/ACT inhaler Commonly known as: VENTOLIN HFA Inhale into the lungs every 6 (six) hours as needed for wheezing or shortness of breath.   aspirin  EC 81 MG tablet Take 1 tablet (81 mg total) by mouth daily.   azelastine 0.1 % nasal spray Commonly known as: ASTELIN Place 2 sprays into both nostrils as needed.   doxycycline 100 MG EC tablet Commonly known as: DORYX Take  100 mg by mouth daily.   lisinopril 40 MG tablet Commonly known as: ZESTRIL Take 40 mg by mouth daily.   lovastatin 40 MG tablet Commonly known as: MEVACOR Take 80 mg by mouth at bedtime.   nitroGLYCERIN  0.4 MG SL tablet Commonly known as: NITROSTAT  Place 1 tablet (0.4 mg total) under the tongue every 5 (five) minutes x 3 doses as needed for chest pain (if no relief after 3rd dose, proceed to ED or call 911).   omeprazole 40 MG capsule Commonly known as: PRILOSEC Take 1 capsule by mouth daily.   Ozempic (0.25 or 0.5 MG/DOSE) 2 MG/1.5ML Sopn Generic drug: Semaglutide(0.25 or 0.5MG /DOS) Inject into the skin.         ROS:  Constitutional: negative for chills, fatigue, and fevers Eyes: negative for visual disturbance and  pain Ears, nose, mouth, throat, and face: negative for ear drainage, sore throat, and sinus problems Respiratory: negative for cough, wheezing, and shortness of breath Cardiovascular: negative for chest pain and palpitations Gastrointestinal: negative for abdominal pain, nausea, reflux symptoms, and vomiting Genitourinary:negative for dysuria and frequency Integument/breast: negative for dryness and rash Hematologic/lymphatic: negative for bleeding and lymphadenopathy Musculoskeletal:negative for back pain and neck pain Neurological: negative for dizziness and tremors Endocrine: negative for temperature intolerance  Blood pressure (!) 155/80, pulse (!) 57, temperature 97.7 F (36.5 C), temperature source Oral, resp. rate 18, height 6' (1.829 m), weight (!) 301 lb (136.5 kg), SpO2 95%. Physical Exam Vitals reviewed.  Constitutional:      Appearance: Normal appearance.  HENT:     Head: Normocephalic and atraumatic.  Eyes:     Extraocular Movements: Extraocular movements intact.     Pupils: Pupils are equal, round, and reactive to light.  Cardiovascular:     Rate and Rhythm: Normal rate and regular rhythm.  Pulmonary:     Effort: Pulmonary effort is normal.     Breath sounds: Normal breath sounds.  Abdominal:     Comments: Abdomen soft, nondistended, no percussion tenderness, nontender to palpation; no rigidity, guarding, rebound tenderness  Musculoskeletal:        General: Normal range of motion.     Cervical back: Normal range of motion.  Skin:    General: Skin is warm and dry.  Neurological:     General: No focal deficit present.     Mental Status: He is alert and oriented to person, place, and time.  Psychiatric:        Mood and Affect: Mood normal.        Behavior: Behavior normal.     Results: CT abdomen and pelvis (08/02/2023): IMPRESSION: Mild enlargement of base of appendix, without inflammatory changes; small proximal appendiceal neoplasm cannot be excluded. No  evidence of mucocele.   Colonic diverticulosis, without radiographic evidence of diverticulitis.   Mild hepatic steatosis.   Small fat-containing umbilical hernia.   Assessment & Plan:  Logan Hunt is a 68 y.o. male who presents to discuss appendectomy.  -We discussed the results of his colonoscopy and CT scan.  We discussed that the CT is showing mild enlargement at the base of the appendix, consistent with the finding of a nodule at the base of the appendix at the time of colonoscopy.  Since we have no tissue diagnosis, I would recommend proceeding with appendectomy.  We discussed that he may need further procedures pending the results of his final biopsy -The risk and benefits of robotic assisted laparoscopic appendectomy were discussed including but not limited to bleeding,  infection, injury to surrounding structures, need for additional procedures.  After careful consideration, Logan Hunt has decided to proceed with surgery. - Patient tentatively scheduled for surgery on 7/21 -Information provided to the patient regarding appendectomy -Discussed that if he has worsening right lower quadrant abdominal pain, nausea, vomiting, fever, and chills, he needs to present to the emergency department for evaluation  All questions were answered to the satisfaction of the patient and family.  Note: Portions of this report may have been transcribed using voice recognition software. Every effort has been made to ensure accuracy; however, inadvertent computerized transcription errors may still be present.   Dorothyann Brittle, DO [facility] [address] [place], [place] [address] [phone] (office)

## 2023-09-03 ENCOUNTER — Telehealth: Payer: Self-pay | Admitting: *Deleted

## 2023-09-03 NOTE — Telephone Encounter (Signed)
 Date: 09/10/2023 Surgery: XI Robotic Assisted Laparoscopic Appendectomy CPT Codes: 55029  Dx: D12.1  Patient wife tested positive for COVID on 08/31/2023.  Patient remains asymptomatic.   Surgery postponed to 09/13/2023. Patient made aware to contact office if symptoms present.

## 2023-09-06 ENCOUNTER — Encounter (HOSPITAL_COMMUNITY)

## 2023-09-07 DIAGNOSIS — I10 Essential (primary) hypertension: Secondary | ICD-10-CM | POA: Diagnosis not present

## 2023-09-07 DIAGNOSIS — Z01818 Encounter for other preprocedural examination: Secondary | ICD-10-CM | POA: Diagnosis not present

## 2023-09-07 DIAGNOSIS — Z299 Encounter for prophylactic measures, unspecified: Secondary | ICD-10-CM | POA: Diagnosis not present

## 2023-09-07 DIAGNOSIS — E1165 Type 2 diabetes mellitus with hyperglycemia: Secondary | ICD-10-CM | POA: Diagnosis not present

## 2023-09-10 NOTE — Patient Instructions (Signed)
 Logan Hunt  09/10/2023     @[email or web address] @   Your procedure is scheduled on  09/13/2023.   Report to [facility] at  1010  A.M.   Call this number if you have problems the morning of surgery:  [phone]  If you experience any cold or flu symptoms such as cough, fever, chills, shortness of breath, etc. between now and your scheduled surgery, please notify us  at the above number.   Remember:         Your last dose of ozempic should have been on 09/05/2023.    Do not eat after midnight.   You may drink clear liquids until  0810 am on 09/13/2023.       Clear liquids allowed are:                    Water , Juice (No red color; non-citric and without pulp; diabetics please choose diet or no sugar options), Carbonated beverages (diabetics please choose diet or no sugar options), Clear Tea (No creamer, milk, or cream, including half & half and powdered creamer), Black Coffee Only (No creamer, milk or cream, including half & half and powdered creamer), and Clear Sports drink (No red color; diabetics please choose diet or no sugar options)    Take these medicines the morning of surgery with A SIP OF WATER                                                     omeprazole.    Do not wear jewelry, make-up or nail polish, including gel polish,  artificial nails, or any other type of covering on natural nails (fingers and  toes).  Do not wear lotions, powders, or perfumes, or deodorant.  Do not shave 48 hours prior to surgery.  Men may shave face and neck.  Do not bring valuables to the hospital.  [facility] is not responsible for any belongings or valuables.  Contacts, dentures or bridgework may not be worn into surgery.  Leave your suitcase in the car.  After surgery it may be brought to your room.  For patients admitted to the hospital, discharge time will be determined by your treatment team.  Patients discharged the day of surgery will not be allowed to drive home  and must have someone with them for 24 hours.    Special instructions:  DO NOT smoke tobacco or vape for 24 hours before your procedure.  Please read over the following fact sheets that you were given. Pain Booklet, Coughing and Deep Breathing, Surgical Site Infection Prevention, Anesthesia Post-op Instructions, and Care and Recovery After Surgery      Laparoscopic Appendectomy, Adult, Care After What can I expect after the procedure? After a laparoscopic appendectomy, it is common to have: Little energy for normal activities. Mild pain in the area where the cuts from surgery (incisions) were made. Trouble pooping (constipation). This may be caused by: Pain medicine. A lack of activity. Gas pain that can spread to your shoulders. Follow these instructions at home: Your doctor may give you more instructions. If you have problems, contact your doctor. Medicines Take over-the-counter and prescription medicines only as told by your doctor. Do not stop taking antibiotics even if you start to feel better. Take pain medicines before your pain gets  very bad. If told, take steps to prevent problems with pooping (constipation). You may need to: Drink enough fluid to keep your pee (urine) pale yellow. Take medicines. You will be told what medicines to take. Eat foods that are high in fiber. These include beans, whole grains, and fresh fruits and vegetables. Limit foods that are high in fat and sugar. These include fried or sweet foods. Ask your doctor if you should avoid driving or using machines while you are taking your medicine. Incision care  Follow instructions from your doctor about how to take care of your incisions. Make sure you: Wash your hands with soap and water  for at least 20 seconds before and after you change your bandage. If you cannot use soap and water , use hand sanitizer. Change your bandage. Leave stitches, staples, or skin glue in place for at least 2 weeks. Leave tape  strips alone unless you are told to take them off. You may trim the edges of the tape strips if they curl up. Check your incisions every day for signs of infection. Check for: Redness, swelling, or pain. Fluid or blood. Warmth. Pus or a bad smell. Bathing Do not take baths, swim, or use a hot tub. Ask your doctor about taking showers or sponge baths. Keep your incisions clean and dry. Clean them as told by your doctor. To do this: Gently wash the incisions with soap and water . Rinse the incisions with water  to get all the soap off. Pat the incisions dry with a clean towel. Do not rub the incisions. Activity  If you were given a sedative during your procedure, do not drive or use machines until your doctor says that it is safe. A sedative is a medicine that helps you relax. Rest as told by your doctor. Do not lift anything that is heavier than 10 lb (4.5 kg). Do not play contact sports until your doctor says that it is safe. Return to your normal activities when your doctor says that it is safe. General instructions If you were sent home with a drain, follow instructions from your doctor on how to care for it. Take deep breaths. This helps to keep your lungs from getting an infection (pneumonia). If you need to cough or sneeze, place a pillow or blanket on your belly before you do so. This will help you control the pain. Keep all follow-up visits. Contact a doctor if: You have any of these signs of infection in an incision: Redness, swelling, or pain. Fluid or blood. Warmth. Pus or a bad smell. Your incisions open after the stitches have been taken out. You lose your appetite. You feel as if you may vomit. You vomit. You get watery poop (diarrhea), or you cannot control your poop. You get a rash. Get help right away if: You have a fever. You have trouble breathing. You have sharp pains in your chest. You have swelling or pain in your legs. You faint. These symptoms may be an  emergency. Get help right away. Call 911. Do not wait to see if the symptoms will go away. Do not drive yourself to the hospital. Summary After the procedure, it is common to have low energy, mild pain, trouble pooping, and gas pain. Follow your doctor's instructions about caring for yourself after the procedure. Rest after the procedure. Return to your normal activities as told by your doctor. Contact your doctor if you see signs of infection around your incisions. Get help right away if you have a  fever, chest pain, or trouble breathing. This information is not intended to replace advice given to you by your health care provider. Make sure you discuss any questions you have with your health care provider. Document Revised: 11/18/2020 Document Reviewed: 11/18/2020 Elsevier Patient Education  2024 Elsevier Inc.General Anesthesia, Adult, Care After The following information offers guidance on how to care for yourself after your procedure. Your health care provider may also give you more specific instructions. If you have problems or questions, contact your health care provider. What can I expect after the procedure? After the procedure, it is common for people to: Have pain or discomfort at the IV site. Have nausea or vomiting. Have a sore throat or hoarseness. Have trouble concentrating. Feel cold or chills. Feel weak, sleepy, or tired (fatigue). Have soreness and body aches. These can affect parts of the body that were not involved in surgery. Follow these instructions at home: For the time period you were told by your health care provider:  Rest. Do not participate in activities where you could fall or become injured. Do not drive or use machinery. Do not drink alcohol . Do not take sleeping pills or medicines that cause drowsiness. Do not make important decisions or sign legal documents. Do not take care of children on your own. General instructions Drink enough fluid to keep your  urine pale yellow. If you have sleep apnea, surgery and certain medicines can increase your risk for breathing problems. Follow instructions from your health care provider about wearing your sleep device: Anytime you are sleeping, including during daytime naps. While taking prescription pain medicines, sleeping medicines, or medicines that make you drowsy. Return to your normal activities as told by your health care provider. Ask your health care provider what activities are safe for you. Take over-the-counter and prescription medicines only as told by your health care provider. Do not use any products that contain nicotine or tobacco. These products include cigarettes, chewing tobacco, and vaping devices, such as e-cigarettes. These can delay incision healing after surgery. If you need help quitting, ask your health care provider. Contact a health care provider if: You have nausea or vomiting that does not get better with medicine. You vomit every time you eat or drink. You have pain that does not get better with medicine. You cannot urinate or have bloody urine. You develop a skin rash. You have a fever. Get help right away if: You have trouble breathing. You have chest pain. You vomit blood. These symptoms may be an emergency. Get help right away. Call 911. Do not wait to see if the symptoms will go away. Do not drive yourself to the hospital. Summary After the procedure, it is common to have a sore throat, hoarseness, nausea, vomiting, or to feel weak, sleepy, or fatigue. For the time period you were told by your health care provider, do not drive or use machinery. Get help right away if you have difficulty breathing, have chest pain, or vomit blood. These symptoms may be an emergency. This information is not intended to replace advice given to you by your health care provider. Make sure you discuss any questions you have with your health care provider. Document Revised: 05/06/2021  Document Reviewed: 05/06/2021 Elsevier Patient Education  2024 Elsevier Inc.How to Use Chlorhexidine  at Home in the Shower Chlorhexidine  gluconate (CHG) is a germ-killing (antiseptic) wash that's used to clean the skin. It can get rid of the germs that normally live on the skin and can keep them away  for about 24 hours. If you're having surgery, you may be told to shower with CHG at home the night before surgery. This can help lower your risk for infection. To use CHG wash in the shower, follow the steps below. Supplies needed: CHG body wash. Clean washcloth. Clean towel. How to use CHG in the shower Follow these steps unless you're told to use CHG in a different way: Start the shower. Use your normal soap and shampoo to wash your face and hair. Turn off the shower or move out of the shower stream. Pour CHG onto a clean washcloth. Do not use any type of brush or rough sponge. Start at your neck, washing your body down to your toes. Make sure you: Wash the part of your body where the surgery will be done for at least 1 minute. Do not scrub. Do not use CHG on your head or face unless your health care provider tells you to. If it gets into your ears or eyes, rinse them well with water . Do not wash your genitals with CHG. Wash your back and under your arms. Make sure to wash skin folds. Let the CHG sit on your skin for 1-2 minutes or as long as told. Rinse your entire body in the shower, including all body creases and folds. Turn off the shower. Dry off with a clean towel. Do not put anything on your skin afterward, such as powder, lotion, or perfume. Put on clean clothes or pajamas. If it's the night before surgery, sleep in clean sheets. General tips Use CHG only as told, and follow the instructions on the label. Use the full amount of CHG as told. This is often one bottle. Do not smoke and stay away from flames after using CHG. Your skin may feel sticky after using CHG. This is normal.  The sticky feeling will go away as the CHG dries. Do not use CHG: If you have a chlorhexidine  allergy or have reacted to chlorhexidine  in the past. On open wounds or areas of skin that have broken skin, cuts, or scrapes. On babies younger than 42 months of age. Contact a health care provider if: You have questions about using CHG. Your skin gets irritated or itchy. You have a rash after using CHG. You swallow any CHG. Call your local poison control center [phone] in the [place] Your eyes itch badly, or they become very red or swollen. Your hearing changes. You have trouble seeing. If you can't reach your provider, go to an urgent care or emergency room. Do not drive yourself. Get help right away if: You have swelling or tingling in your mouth or throat. You make high-pitched whistling sounds when you breathe, most often when you breathe out (wheeze). You have trouble breathing. These symptoms may be an emergency. Call 911 right away. Do not wait to see if the symptoms will go away. Do not drive yourself to the hospital. This information is not intended to replace advice given to you by your health care provider. Make sure you discuss any questions you have with your health care provider. Document Revised: 08/22/2022 Document Reviewed: 08/18/2021 Elsevier Patient Education  2024 [employer].

## 2023-09-11 ENCOUNTER — Encounter (HOSPITAL_COMMUNITY)
Admission: RE | Admit: 2023-09-11 | Discharge: 2023-09-11 | Disposition: A | Discharge status: Home / Self Care | Source: Ambulatory Visit | Attending: Surgery

## 2023-09-11 ENCOUNTER — Encounter (HOSPITAL_COMMUNITY): Payer: Self-pay

## 2023-09-11 VITALS — HR 61 | Resp 18 | Ht 72.0 in | Wt 301.0 lb

## 2023-09-11 DIAGNOSIS — E119 Type 2 diabetes mellitus without complications: Secondary | ICD-10-CM | POA: Insufficient documentation

## 2023-09-11 DIAGNOSIS — Z01812 Encounter for preprocedural laboratory examination: Secondary | ICD-10-CM | POA: Insufficient documentation

## 2023-09-11 HISTORY — DX: Sleep apnea, unspecified: G47.30

## 2023-09-11 LAB — BASIC METABOLIC PANEL WITH GFR
Anion gap: 7 (ref 5–15)
BUN: 14 mg/dL (ref 8–23)
CO2: 23 mmol/L (ref 22–32)
Calcium: 8.7 mg/dL — ABNORMAL LOW (ref 8.9–10.3)
Chloride: 104 mmol/L (ref 98–111)
Creatinine, Ser: 0.92 mg/dL (ref 0.61–1.24)
GFR, Estimated: >60 mL/min (ref >60)
Glucose, Bld: 120 mg/dL — ABNORMAL HIGH (ref 70–99)
Potassium: 4 mmol/L (ref 3.5–5.1)
Sodium: 134 mmol/L — ABNORMAL LOW (ref 135–145)

## 2023-09-11 LAB — HEMOGLOBIN A1C
Hgb A1c MFr Bld: 6.6 % — ABNORMAL HIGH (ref 4.8–5.6)
Mean Plasma Glucose: 142.72 mg/dL

## 2023-09-13 ENCOUNTER — Encounter (HOSPITAL_COMMUNITY)
Admission: RE | Disposition: A | Discharge status: Home / Self Care | Payer: Self-pay | Source: Home / Self Care | Attending: Surgery

## 2023-09-13 ENCOUNTER — Ambulatory Visit (HOSPITAL_COMMUNITY)
Admission: RE | Admit: 2023-09-13 | Discharge: 2023-09-13 | Disposition: A | Discharge status: Home / Self Care | Attending: Surgery | Admitting: Surgery

## 2023-09-13 ENCOUNTER — Ambulatory Visit (HOSPITAL_COMMUNITY): Payer: Self-pay | Admitting: Anesthesiology

## 2023-09-13 DIAGNOSIS — K382 Diverticulum of appendix: Secondary | ICD-10-CM | POA: Insufficient documentation

## 2023-09-13 DIAGNOSIS — I2510 Atherosclerotic heart disease of native coronary artery without angina pectoris: Secondary | ICD-10-CM

## 2023-09-13 DIAGNOSIS — I10 Essential (primary) hypertension: Secondary | ICD-10-CM

## 2023-09-13 DIAGNOSIS — K388 Other specified diseases of appendix: Secondary | ICD-10-CM | POA: Diagnosis not present

## 2023-09-13 DIAGNOSIS — D121 Benign neoplasm of appendix: Secondary | ICD-10-CM | POA: Diagnosis not present

## 2023-09-13 DIAGNOSIS — Z87891 Personal history of nicotine dependence: Secondary | ICD-10-CM

## 2023-09-13 DIAGNOSIS — E119 Type 2 diabetes mellitus without complications: Secondary | ICD-10-CM | POA: Insufficient documentation

## 2023-09-13 DIAGNOSIS — K219 Gastro-esophageal reflux disease without esophagitis: Secondary | ICD-10-CM | POA: Insufficient documentation

## 2023-09-13 DIAGNOSIS — Z8673 Personal history of transient ischemic attack (TIA), and cerebral infarction without residual deficits: Secondary | ICD-10-CM | POA: Diagnosis not present

## 2023-09-13 DIAGNOSIS — Z7985 Long-term (current) use of injectable non-insulin antidiabetic drugs: Secondary | ICD-10-CM | POA: Diagnosis not present

## 2023-09-13 DIAGNOSIS — I252 Old myocardial infarction: Secondary | ICD-10-CM | POA: Insufficient documentation

## 2023-09-13 DIAGNOSIS — G4733 Obstructive sleep apnea (adult) (pediatric): Secondary | ICD-10-CM | POA: Diagnosis not present

## 2023-09-13 DIAGNOSIS — K429 Umbilical hernia without obstruction or gangrene: Secondary | ICD-10-CM | POA: Insufficient documentation

## 2023-09-13 DIAGNOSIS — K635 Polyp of colon: Secondary | ICD-10-CM | POA: Insufficient documentation

## 2023-09-13 DIAGNOSIS — Z7982 Long term (current) use of aspirin: Secondary | ICD-10-CM | POA: Insufficient documentation

## 2023-09-13 DIAGNOSIS — K660 Peritoneal adhesions (postprocedural) (postinfection): Secondary | ICD-10-CM | POA: Diagnosis not present

## 2023-09-13 HISTORY — PX: LAPAROSCOPIC LYSIS OF ADHESIONS: SHX5905

## 2023-09-13 HISTORY — PX: XI ROBOTIC LAPAROSCOPIC ASSISTED APPENDECTOMY: SHX6877

## 2023-09-13 LAB — GLUCOSE, CAPILLARY
Glucose-Capillary: 127 mg/dL — ABNORMAL HIGH (ref 70–99)
Glucose-Capillary: 128 mg/dL — ABNORMAL HIGH (ref 70–99)

## 2023-09-13 SURGERY — APPENDECTOMY, ROBOT-ASSISTED, LAPAROSCOPIC
Anesthesia: General | Site: Abdomen

## 2023-09-13 MED ORDER — CHLORHEXIDINE GLUCONATE CLOTH 2 % EX PADS: 6.0000 | MEDICATED_PAD | Freq: Once | CUTANEOUS | Status: DC

## 2023-09-13 MED ORDER — LIDOCAINE 2% (20 MG/ML) 5 ML SYRINGE
INTRAMUSCULAR | Status: AC
  Filled 2023-09-13: qty 5

## 2023-09-13 MED ORDER — SODIUM CHLORIDE 0.9 % IV SOLN
INTRAVENOUS | Status: AC
  Filled 2023-09-13: qty 2

## 2023-09-13 MED ORDER — OXYCODONE HCL 5 MG/5ML PO SOLN: 5.0000 mg | Freq: Once | ORAL | Status: DC | PRN

## 2023-09-13 MED ORDER — ROCURONIUM BROMIDE 10 MG/ML (PF) SYRINGE
PREFILLED_SYRINGE | INTRAVENOUS | Status: AC
  Filled 2023-09-13: qty 10

## 2023-09-13 MED ORDER — LIDOCAINE 2% (20 MG/ML) 5 ML SYRINGE
INTRAMUSCULAR | Status: DC | PRN
  Administered 2023-09-13: 60 mg via INTRAVENOUS

## 2023-09-13 MED ORDER — SUGAMMADEX SODIUM 200 MG/2ML IV SOLN
INTRAVENOUS | Status: AC
  Filled 2023-09-13: qty 2

## 2023-09-13 MED ORDER — LACTATED RINGERS IV SOLN: INTRAVENOUS | Status: DC

## 2023-09-13 MED ORDER — SUGAMMADEX SODIUM 200 MG/2ML IV SOLN: INTRAVENOUS | Status: DC | PRN

## 2023-09-13 MED ORDER — ORAL CARE MOUTH RINSE: 15.0000 mL | Freq: Once | OROMUCOSAL | Status: AC

## 2023-09-13 MED ORDER — PHENYLEPHRINE 80 MCG/ML (10ML) SYRINGE FOR IV PUSH (FOR BLOOD PRESSURE SUPPORT)
PREFILLED_SYRINGE | INTRAVENOUS | Status: AC
  Filled 2023-09-13: qty 10

## 2023-09-13 MED ORDER — FENTANYL CITRATE (PF) 100 MCG/2ML IJ SOLN
INTRAMUSCULAR | Status: DC | PRN
  Administered 2023-09-13 (×4): 50 ug via INTRAVENOUS

## 2023-09-13 MED ORDER — HYDROMORPHONE HCL 1 MG/ML IJ SOLN
INTRAMUSCULAR | Status: AC
  Filled 2023-09-13: qty 0.5

## 2023-09-13 MED ORDER — OXYCODONE HCL 5 MG PO TABS: 5.0000 mg | ORAL_TABLET | Freq: Once | ORAL | Status: DC | PRN

## 2023-09-13 MED ORDER — ACETAMINOPHEN 500 MG PO TABS: 1000.0000 mg | ORAL_TABLET | Freq: Four times a day (QID) | ORAL | 0 refills | Status: AC

## 2023-09-13 MED ORDER — ONDANSETRON HCL 4 MG/2ML IJ SOLN
INTRAMUSCULAR | Status: AC
  Filled 2023-09-13: qty 2

## 2023-09-13 MED ORDER — OXYCODONE HCL 5 MG PO TABS: 5.0000 mg | ORAL_TABLET | Freq: Four times a day (QID) | ORAL | 0 refills | Status: DC | PRN

## 2023-09-13 MED ORDER — PROPOFOL 10 MG/ML IV BOLUS
INTRAVENOUS | Status: AC
  Filled 2023-09-13: qty 20

## 2023-09-13 MED ORDER — ACETAMINOPHEN 10 MG/ML IV SOLN
INTRAVENOUS | Status: AC
  Filled 2023-09-13: qty 100

## 2023-09-13 MED ORDER — BUPIVACAINE HCL (PF) 0.5 % IJ SOLN
INTRAMUSCULAR | Status: DC | PRN
  Administered 2023-09-13: 30 mL

## 2023-09-13 MED ORDER — ACETAMINOPHEN 10 MG/ML IV SOLN
INTRAVENOUS | Status: DC | PRN
  Administered 2023-09-13: 1000 mg via INTRAVENOUS

## 2023-09-13 MED ORDER — FENTANYL CITRATE PF 50 MCG/ML IJ SOSY: 25.0000 ug | PREFILLED_SYRINGE | INTRAMUSCULAR | Status: DC | PRN

## 2023-09-13 MED ORDER — SUGAMMADEX SODIUM 200 MG/2ML IV SOLN
INTRAVENOUS | Status: AC
Start: 2023-09-13 — End: 2023-09-13
  Filled 2023-09-13: qty 2

## 2023-09-13 MED ORDER — ONDANSETRON HCL 4 MG/2ML IJ SOLN: 4.0000 mg | Freq: Once | INTRAMUSCULAR | Status: DC | PRN

## 2023-09-13 MED ORDER — FENTANYL CITRATE (PF) 100 MCG/2ML IJ SOLN
INTRAMUSCULAR | Status: AC
  Filled 2023-09-13: qty 2

## 2023-09-13 MED ORDER — ASPIRIN EC 81 MG PO TBEC: 81.0000 mg | DELAYED_RELEASE_TABLET | Freq: Every day | ORAL | Status: AC

## 2023-09-13 MED ORDER — SUGAMMADEX SODIUM 200 MG/2ML IV SOLN
INTRAVENOUS | Status: DC | PRN
  Administered 2023-09-13: 300 mg via INTRAVENOUS

## 2023-09-13 MED ORDER — ONDANSETRON HCL 4 MG/2ML IJ SOLN
INTRAMUSCULAR | Status: DC | PRN
  Administered 2023-09-13: 4 mg via INTRAVENOUS

## 2023-09-13 MED ORDER — HYDROMORPHONE HCL 1 MG/ML IJ SOLN
INTRAMUSCULAR | Status: DC | PRN
  Administered 2023-09-13 (×2): .5 mg via INTRAVENOUS

## 2023-09-13 MED ORDER — DOCUSATE SODIUM 100 MG PO CAPS: 100.0000 mg | ORAL_CAPSULE | Freq: Two times a day (BID) | ORAL | 2 refills | Status: AC

## 2023-09-13 MED ORDER — CHLORHEXIDINE GLUCONATE CLOTH 2 % EX PADS
6.0000 | MEDICATED_PAD | Freq: Once | CUTANEOUS | Status: AC
  Administered 2023-09-13: 6 via TOPICAL

## 2023-09-13 MED ORDER — PROPOFOL 10 MG/ML IV BOLUS
INTRAVENOUS | Status: DC | PRN
  Administered 2023-09-13: 200 mg via INTRAVENOUS

## 2023-09-13 MED ORDER — FENTANYL CITRATE (PF) 100 MCG/2ML IJ SOLN
INTRAMUSCULAR | Status: AC
Start: 2023-09-13 — End: 2023-09-13
  Filled 2023-09-13: qty 2

## 2023-09-13 MED ORDER — BUPIVACAINE HCL (PF) 0.5 % IJ SOLN
INTRAMUSCULAR | Status: AC
  Filled 2023-09-13: qty 30

## 2023-09-13 MED ORDER — SODIUM CHLORIDE 0.9 % IV SOLN
2.0000 g | INTRAVENOUS | Status: AC
  Administered 2023-09-13: 2 g via INTRAVENOUS

## 2023-09-13 MED ORDER — DEXAMETHASONE SODIUM PHOSPHATE 10 MG/ML IJ SOLN
INTRAMUSCULAR | Status: DC | PRN
Start: 2023-09-13 — End: 2023-09-13
  Administered 2023-09-13: 6 mg via INTRAVENOUS

## 2023-09-13 MED ORDER — PHENYLEPHRINE 80 MCG/ML (10ML) SYRINGE FOR IV PUSH (FOR BLOOD PRESSURE SUPPORT)
PREFILLED_SYRINGE | INTRAVENOUS | Status: DC | PRN
  Administered 2023-09-13: 80 ug via INTRAVENOUS

## 2023-09-13 MED ORDER — CHLORHEXIDINE GLUCONATE 0.12 % MT SOLN
15.0000 mL | Freq: Once | OROMUCOSAL | Status: AC
  Administered 2023-09-13: 15 mL via OROMUCOSAL

## 2023-09-13 MED ORDER — DEXAMETHASONE SODIUM PHOSPHATE 10 MG/ML IJ SOLN
INTRAMUSCULAR | Status: AC
  Filled 2023-09-13: qty 1

## 2023-09-13 MED ORDER — ROCURONIUM BROMIDE 10 MG/ML (PF) SYRINGE
PREFILLED_SYRINGE | INTRAVENOUS | Status: DC | PRN
  Administered 2023-09-13: 60 mg via INTRAVENOUS
  Administered 2023-09-13: 40 mg via INTRAVENOUS

## 2023-09-13 MED ORDER — STERILE WATER FOR IRRIGATION IR SOLN
Status: DC | PRN
Start: 2023-09-13 — End: 2023-09-13
  Administered 2023-09-13: 1000 mL

## 2023-09-13 SURGICAL SUPPLY — 43 items
BLADE SURG 15 STRL LF DISP TIS (BLADE) IMPLANT
CHLORAPREP W/TINT 26 (MISCELLANEOUS) ×1 IMPLANT
COVER LIGHT HANDLE (MISCELLANEOUS) IMPLANT
COVER MAYO STAND XLG (MISCELLANEOUS) ×1 IMPLANT
COVER TIP SHEARS 8 DVNC (MISCELLANEOUS) IMPLANT
DEFOGGER SCOPE WARMER CLEARIFY (MISCELLANEOUS) ×1 IMPLANT
DERMABOND ADVANCED .7 DNX12 (GAUZE/BANDAGES/DRESSINGS) ×1 IMPLANT
DRAPE ARM DVNC X/XI (DISPOSABLE) ×3 IMPLANT
DRAPE COLUMN DVNC XI (DISPOSABLE) ×1 IMPLANT
FORCEPS BPLR R/ABLATION 8 DVNC (INSTRUMENTS) ×1 IMPLANT
GLOVE BIO SURGEON STRL SZ7.5 (GLOVE) IMPLANT
GLOVE BIOGEL PI IND STRL 6.5 (GLOVE) ×2 IMPLANT
GLOVE BIOGEL PI IND STRL 7.0 (GLOVE) ×3 IMPLANT
GLOVE BIOGEL PI IND STRL 8 (GLOVE) IMPLANT
GLOVE SURG SS PI 6.5 STRL IVOR (GLOVE) ×2 IMPLANT
GOWN STRL REUS W/TWL LRG LVL3 (GOWN DISPOSABLE) ×3 IMPLANT
GRASPER SUT TROCAR 14GX15 (MISCELLANEOUS) ×1 IMPLANT
IRRIGATOR SUCT 8 DISP DVNC XI (IRRIGATION / IRRIGATOR) IMPLANT
KIT PINK PAD W/HEAD ARM REST (MISCELLANEOUS) ×1 IMPLANT
KIT TURNOVER KIT A (KITS) ×1 IMPLANT
MANIFOLD NEPTUNE II (INSTRUMENTS) ×1 IMPLANT
NDL HYPO 21X1.5 SAFETY (NEEDLE) ×1 IMPLANT
NDL INSUFFLATION 14GA 120MM (NEEDLE) ×1 IMPLANT
NEEDLE HYPO 21X1.5 SAFETY (NEEDLE) ×1 IMPLANT
NEEDLE INSUFFLATION 14GA 120MM (NEEDLE) ×1 IMPLANT
OBTURATOR OPTICALSTD 8 DVNC (TROCAR) ×1 IMPLANT
PACK LAP CHOLE LZT030E (CUSTOM PROCEDURE TRAY) ×1 IMPLANT
PENCIL HANDSWITCHING (ELECTRODE) ×1 IMPLANT
RELOAD STAPLE 30 2.5 WHT DVNC (STAPLE) ×1 IMPLANT
SCISSORS MNPLR CVD DVNC XI (INSTRUMENTS) IMPLANT
SEAL UNIV 5-12 XI (MISCELLANEOUS) ×4 IMPLANT
SEALER VESSEL EXT DVNC XI (MISCELLANEOUS) ×1 IMPLANT
SET BASIN LINEN APH (SET/KITS/TRAYS/PACK) ×1 IMPLANT
SET TUBE SMOKE EVAC HIGH FLOW (TUBING) ×1 IMPLANT
STAPLER 30 CRVD 8 SUREFORM (STAPLE) ×1 IMPLANT
SUT MNCRL AB 4-0 PS2 18 (SUTURE) ×1 IMPLANT
SUT SILK 2-0 18XBRD TIE 12 (SUTURE) ×1 IMPLANT
SUT VICRYL 0 UR6 27IN ABS (SUTURE) ×1 IMPLANT
SYR 30ML LL (SYRINGE) ×1 IMPLANT
SYSTEM RETRIEVL 5MM INZII UNIV (BASKET) ×1 IMPLANT
TAPE TRANSPORE STRL 2 31045 (GAUZE/BANDAGES/DRESSINGS) ×1 IMPLANT
TRAY FOLEY W/BAG SLVR 16FR ST (SET/KITS/TRAYS/PACK) ×1 IMPLANT
WATER STERILE IRR 500ML POUR (IV SOLUTION) ×1 IMPLANT

## 2023-09-13 NOTE — Anesthesia Preprocedure Evaluation (Signed)
 Anesthesia Evaluation  Patient identified by MRN, date of birth, ID band Patient awake    Reviewed: Allergy & Precautions, H&P , NPO status , Patient's Chart, lab work & pertinent test results, reviewed documented beta blocker date and time   Airway Mallampati: II  TM Distance: >3 FB Neck ROM: full    Dental no notable dental hx. (+) Partial Lower, Poor Dentition, Missing   Pulmonary sleep apnea , former smoker   Pulmonary exam normal breath sounds clear to auscultation       Cardiovascular Exercise Tolerance: Good hypertension, + CAD and + Past MI   Rhythm:regular Rate:Normal     Neuro/Psych TIA negative psych ROS   GI/Hepatic negative GI ROS, Neg liver ROS,,,  Endo/Other  diabetes    Renal/GU negative Renal ROS  negative genitourinary   Musculoskeletal   Abdominal   Peds  Hematology negative hematology ROS (+)   Anesthesia Other Findings One tooth, not loose.  Reproductive/Obstetrics negative OB ROS                              Anesthesia Physical Anesthesia Plan  ASA: 3  Anesthesia Plan: General and General ETT   Post-op Pain Management:    Induction:   PONV Risk Score and Plan: Ondansetron   Airway Management Planned:   Additional Equipment:   Intra-op Plan:   Post-operative Plan:   Informed Consent: I have reviewed the patients History and Physical, chart, labs and discussed the procedure including the risks, benefits and alternatives for the proposed anesthesia with the patient or authorized representative who has indicated his/her understanding and acceptance.     Dental Advisory Given  Plan Discussed with: CRNA  Anesthesia Plan Comments:         Anesthesia Quick Evaluation

## 2023-09-13 NOTE — Discharge Instructions (Signed)
 Ambulatory Surgery Discharge Instructions  General Anesthesia or Sedation Do not drive or operate heavy machinery for 24 hours.  Do not consume alcohol, tranquilizers, sleeping medications, or any non-prescribed medications for 24 hours. Do not make important decisions or sign any important papers in the next 24 hours. You should have someone with you tonight at home.  Activity  You are advised to go directly home from the hospital.  Restrict your activities and rest for a day.  Resume light activity tomorrow. No heavy lifting over 10 lbs or strenuous exercise.  Fluids and Diet Begin with clear liquids, bouillon, dry toast, soda crackers.  If not nauseated, you may go to a regular diet when you desire.  Greasy and spicy foods are not advised.  Medications  If you have not had a bowel movement in 24 hours, take 2 tablespoons over the counter Milk of mag.             You May resume your blood thinners tomorrow (Aspirin, coumadin, or other).  You are being discharged with prescriptions for Opioid/Narcotic Medications: There are some specific considerations for these medications that you should know. Opioid Meds have risks & benefits. Addiction to these meds is always a concern with prolonged use Take medication only as directed Do not drive while taking narcotic pain medication Do not crush tablets or capsules Do not use a different container than medication was dispensed in Lock the container of medication in a cool, dry place out of reach of children and pets. Opioid medication can cause addiction Do not share with anyone else (this is a felony) Do not store medications for future use. Dispose of them properly.     Disposal:  Find a [employer] household drug take back site near you.  If you can't get to a drug take back site, use the recipe below as a last resort to dispose of expired, unused or unwanted drugs. Disposal  (Do not dispose chemotherapy drugs this way, talk to your  prescribing doctor instead.) Step 1: Mix drugs (do not crush) with dirt, kitty litter, or used coffee grounds and add a small amount of water to dissolve any solid medications. Step 2: Seal drugs in plastic bag. Step 3: Place plastic bag in trash. Step 4: Take prescription container and scratch out personal information, then recycle or throw away.  Operative Site  You have a liquid bandage over your incisions, this will begin to flake off in about a week. Ok to English as a second language teacher. Keep wound clean and dry. No baths or swimming. No lifting more than 10 pounds.  Contact Information: If you have questions or concerns, please call our office, [phone], Monday- Thursday 8AM-5PM and Friday 8AM-12Noon.  If it is after hours or on the weekend, please call [facility], [phone], and ask to speak to the surgeon on call for Dr. Robyne Peers at [facility].   SPECIFIC COMPLICATIONS TO WATCH FOR: Inability to urinate Fever over 101? F by mouth Nausea and vomiting lasting longer than 24 hours. Pain not relieved by medication ordered Swelling around the operative site Increased redness, warmth, hardness, around operative area Numbness, tingling, or cold fingers or toes Blood -soaked dressing, (small amounts of oozing may be normal) Increasing and progressive drainage from surgical area or exam site

## 2023-09-13 NOTE — Op Note (Addendum)
 [facility]  Operative Note  Preoperative diagnosis: Appendiceal lesion  Postoperative diagnosis: Appendiceal lesion, intra-abdominal adhesions  Procedure: Robotic assisted laparoscopic appendectomy, laparoscopic lysis of adhesions  Anesthesia: General   Surgeon: Dorothyann Brittle, DO  Wound Classification: Clean contaminated  Specimen: Appendix  Complications: None  Estimated Blood Loss: Minimal  Indications: Patient is a 68 y.o. male who presents for elective appendectomy.  He underwent a colonoscopy on 6/6 with Dr. Cinderella, at which time he was noted to have a 7 mm nodule at the appendiceal orifice with mucopurulent discharge.  He subsequently underwent a CT of the abdomen and pelvis which demonstrated a mild enlargement at the base of the appendix without inflammatory changes.  We plan for surgery today to evaluate for an appendiceal neoplasm.. The risk of surgery were explained to the patient including but not limited to bleeding, infection, finding a rupture, injury to other organs, needing to do an open procedure.   FIndings: Mildly inflamed appendix with an enlarged base  Description of procedure: The patient was placed on the operating table in the supine position, left arm tucked. General anesthesia was induced. A time-out was completed verifying correct patient, procedure, site, positioning, and implant(s) and/or special equipment prior to beginning this procedure. The abdomen was prepped and draped in the usual sterile fashion.   A supraumbilical incision was made and Veress needle was inserted.  After confirming intraabdominal location with positive saline drop test and low insufflation pressures, gas insufflation was initiated until the abdominal pressure was measured at 15 mmHg.  Afterwards, the Veress needle was removed and a 8 mm port was placed through the incision using Optiview technique. No injuries were noted.  Two additional incisions were made 8  cm apart along the left side of the abdominal wall from the initial incision.  8 mm ports were then placed under direct visualization.  No injuries from trocar placements were noted. The table flexed and was placed in the Trendelenburg position with the right side elevated.  Xi robotic platform was then brought to the operative field and docked. A vessel sealer was placed through the midline port and a forced bipolar through the lower 8 mm port.   The patient was noted to have omentum adhesed within an umbilical hernia sac.  This omentum was taken down with vessel sealer.  Attention was then turned to the appendix.  An appendix, that appeared mildly inflamed with an enlarged base, was identified and elevated.  The mesoappendix was taken down with vessel sealer.  A white load linear cutting stapler was used to divide and staple the base of the appendix to include a cuff of cecum.  No bleeding from the staple lines noted.  The appendix was placed in an endoscopic retrieval bag and removed through the midline port.   The appendiceal stump staple line and mesoappendix were examined again and hemostasis noted. No other pathology was identified within pelvis. The midline port site closed with PMI using 0 vicryl under direct vision. Remaining trocars were removed. No bleeding was noted.The abdomen was allowed to collapse.  Marcaine  was instilled at the incision sites.  All skin incisions then closed with subcuticular sutures Monocryl 4-0.  Wounds then dressed with dermabond.  The patient tolerated the procedure well, awakened from anesthesia and was taken to the postanesthesia care unit in satisfactory condition.  Sponge count and instrument count correct at the end of the procedure.  Dorothyann Brittle, DO [facility] [address] [place], [place]  [address] [phone] (office)

## 2023-09-13 NOTE — Anesthesia Procedure Notes (Signed)
 Date/Time: 09/13/2023 2:03 PM  Performed by: Para Jerelene CROME, CRNAOxygen Delivery Method: Nasal cannula

## 2023-09-13 NOTE — Anesthesia Procedure Notes (Signed)
 Procedure Name: Intubation Date/Time: 09/13/2023 12:36 PM  Performed by: Para Jerelene CROME, CRNAPre-anesthesia Checklist: Patient identified, Emergency Drugs available, Suction available and Patient being monitored Patient Re-evaluated:Patient Re-evaluated prior to induction Oxygen  Delivery Method: Circle system utilized Preoxygenation: Pre-oxygenation with 100% oxygen  Induction Type: IV induction Ventilation: Mask ventilation without difficulty Tube type: Oral Number of attempts: 1 Airway Equipment and Method: Stylet Placement Confirmation: ETT inserted through vocal cords under direct vision, positive ETCO2, CO2 detector and breath sounds checked- equal and bilateral Secured at: 24 (OETT positioned 24 cm at lower lip.) cm Tube secured with: Tape Dental Injury: Teeth and Oropharynx as per pre-operative assessment  Comments: Atraumatic intubation. Lips and teeth remain in preoperative condition.

## 2023-09-13 NOTE — Transfer of Care (Addendum)
 Immediate Anesthesia Transfer of Care Note  Patient: Logan Hunt  Procedure(s) Performed: APPENDECTOMY, ROBOT-ASSISTED, LAPAROSCOPIC (Abdomen) LYSIS, ADHESIONS, LAPAROSCOPIC (Abdomen)  Patient Location: PACU  Anesthesia Type:General  Level of Consciousness: drowsy and patient cooperative  Airway & Oxygen  Therapy: Patient Spontanous Breathing and Patient connected to face mask oxygen   Post-op Assessment: Report given to RN and Post -op Vital signs reviewed and stable  Post vital signs: Reviewed and stable  Last Vitals:  Vitals Value Taken Time  BP 166/88 09/13/23 14:15  Temp 98.4 09/13/23   14:15  Pulse 74 09/13/23 14:12  Resp 19 09/13/23 14:17  SpO2 97 % 09/13/23 14:12  Vitals shown include unfiled device data.  Last Pain:  Vitals:   09/13/23 1045  PainSc: 0-No pain         Complications: Patient laying on right side. Unwilling to lay flat on back when instructed to do so. Noted to rub eyes as witnessed by PACU RN multiple times. PACU RN instructed patient to remove hands from eyes to prevent injury. PACU RN redirected patient's hands away from face.Intermittently cooperative. Report given to PACU RN.

## 2023-09-13 NOTE — Progress Notes (Signed)
[facility]  Spoke with the patient's family in the consultation room.  I explained that he tolerated the procedure without difficulty.  He has dissolvable stitches under the skin with overlying skin glue.  This will flake off in 10 to 14 days.  I discharged him home with a prescription for narcotic pain medication that they should take as needed for pain.  I also want him taking scheduled Tylenol.  If they take the narcotic pain medication, they should take a stool softener as well.  The patient will follow-up with me in 2 weeks.  All questions were answered to their expressed satisfaction.  Theophilus Kinds, DO [facility] [address] [place], [place] [address] [phone] (office)

## 2023-09-13 NOTE — Interval H&P Note (Signed)
 History and Physical Interval Note:  09/13/2023 12:10 PM  Logan Hunt  has presented today for surgery, with the diagnosis of APPENDICEAL LESION.  The various methods of treatment have been discussed with the patient and family. After consideration of risks, benefits and other options for treatment, the patient has consented to  Procedure(s): APPENDECTOMY, ROBOT-ASSISTED, LAPAROSCOPIC (N/A) as a surgical intervention.  The patient's history has been reviewed, patient examined, no change in status, stable for surgery.  I have reviewed the patient's chart and labs.  Questions were answered to the patient's satisfaction.     Brionna Romanek A Rheya Minogue

## 2023-09-14 ENCOUNTER — Encounter (HOSPITAL_COMMUNITY): Payer: Self-pay | Admitting: Surgery

## 2023-09-15 NOTE — Anesthesia Postprocedure Evaluation (Signed)
 Anesthesia Post Note  Patient: Logan Hunt  Procedure(s) Performed: APPENDECTOMY, ROBOT-ASSISTED, LAPAROSCOPIC (Abdomen) LYSIS, ADHESIONS, LAPAROSCOPIC (Abdomen)  Patient location during evaluation: Phase II Anesthesia Type: General Level of consciousness: awake Pain management: pain level controlled Vital Signs Assessment: post-procedure vital signs reviewed and stable Respiratory status: spontaneous breathing and respiratory function stable Cardiovascular status: blood pressure returned to baseline and stable Postop Assessment: no headache and no apparent nausea or vomiting Anesthetic complications: no Comments: Late entry   No notable events documented.   Last Vitals:  Vitals:   09/13/23 1440 09/13/23 1450  BP: (!) 172/73 (!) 168/78  Pulse: 69 72  Resp: 19   Temp: 36.6 C 36.7 C  SpO2: 98% 98%    Last Pain:  Vitals:   09/14/23 1419  TempSrc:   PainSc: 0-No pain                 Yvonna JINNY Bosworth

## 2023-09-17 LAB — SURGICAL PATHOLOGY

## 2023-09-18 ENCOUNTER — Telehealth (INDEPENDENT_AMBULATORY_CARE_PROVIDER_SITE_OTHER): Payer: Self-pay | Admitting: Surgery

## 2023-09-18 DIAGNOSIS — Z09 Encounter for follow-up examination after completed treatment for conditions other than malignant neoplasm: Secondary | ICD-10-CM

## 2023-09-18 NOTE — Telephone Encounter (Signed)
 [facility]  Called to update the patient regarding the pathology results.  Appendix was benign without evidence of dysplasia or malignancy.  He has been doing well since the surgery.  He will follow up with me on 8/6.  All questions were answered to his expressed satisfaction.  Pathology: A. APPENDIX WITH CUFF OF CECUM:  - Benign appendix with diverticulum  - Polypoid fragment of cecal mucosa with hyperplastic change  - Fibrous obliteration of tip  - No dysplasia or malignancy identified.  Multiple levels examined.   Dorothyann Brittle, DO [facility] [address] [place], [place] [address] [phone] (office)

## 2023-09-26 ENCOUNTER — Encounter: Payer: Self-pay | Admitting: Surgery

## 2023-09-26 ENCOUNTER — Ambulatory Visit (INDEPENDENT_AMBULATORY_CARE_PROVIDER_SITE_OTHER): Admitting: Surgery

## 2023-09-26 VITALS — BP 160/74 | HR 61 | Temp 97.8°F | Resp 16 | Ht 72.0 in | Wt 303.0 lb

## 2023-09-26 DIAGNOSIS — Z09 Encounter for follow-up examination after completed treatment for conditions other than malignant neoplasm: Secondary | ICD-10-CM

## 2023-09-26 NOTE — Progress Notes (Signed)
 [facility] Note   HPI:  68 y.o. Male presents to clinic for post-op follow-up s/p robotic assisted laparoscopic appendectomy on 7/24.  Patient has been doing well since his surgery.  He is tolerating a diet without nausea and vomiting, moving his bowels without issue.  He denies any abdominal pain.  He denies any issues at his incision sites.  Review of Systems:  All other review of systems: otherwise negative   Vital Signs:  BP (!) 160/74   Pulse 61   Temp 97.8 F (36.6 C) (Oral)   Resp 16   Ht 6' (1.829 m)   Wt (!) 303 lb (137.4 kg)   SpO2 94%   BMI 41.09 kg/m    Physical Exam:  Physical Exam Vitals reviewed.  Constitutional:      Appearance: Normal appearance.  Abdominal:     Comments: Abdomen soft, nondistended, no percussion tenderness, nontender to palpation; no rigidity, guarding, rebound tenderness: Laparoscopic incision sites healing well with skin glue in place  Neurological:     Mental Status: He is alert.     Laboratory studies: None   Imaging:  None  Pathology: A. APPENDIX WITH CUFF OF CECUM:  - Benign appendix with diverticulum  - Polypoid fragment of cecal mucosa with hyperplastic change  - Fibrous obliteration of tip  - No dysplasia or malignancy identified.  Multiple levels examined.   Assessment:  68 y.o. yo Male who presents for follow-up status post robotic assisted laparoscopic appendectomy on 7/24.  Plan:  - Patient has been doing well since his surgery.  Tolerating a diet, moving his bowels, and pain well-controlled - We discussed his pathology and that he does not require any further surgeries related to his appendix.  He should follow-up with GI for repeat colonoscopy in 7 to 10 years per their recommendation after colonoscopy - Advised that he can slowly start increasing his activities back to his normal activity prior to surgery - Follow up with me as needed  All of the above recommendations were discussed with the  patient and patient's family, and all of patient's and family's questions were answered to heir expressed satisfaction.  Note: Portions of this report may have been transcribed using voice recognition software. Every effort has been made to ensure accuracy; however, inadvertent computerized transcription errors may still be present.   Dorothyann Brittle, DO [facility] [address] [place] [address] [place], [place] [address] [phone] (office)

## 2023-09-26 NOTE — Patient Instructions (Signed)
 Pathology: A. APPENDIX WITH CUFF OF CECUM:  - Benign appendix with diverticulum  - Polypoid fragment of cecal mucosa with hyperplastic change  - Fibrous obliteration of tip  - No dysplasia or malignancy identified.  Multiple levels examined.

## 2023-10-17 DIAGNOSIS — E1169 Type 2 diabetes mellitus with other specified complication: Secondary | ICD-10-CM | POA: Diagnosis not present

## 2023-10-17 DIAGNOSIS — I10 Essential (primary) hypertension: Secondary | ICD-10-CM | POA: Diagnosis not present

## 2023-10-17 DIAGNOSIS — E119 Type 2 diabetes mellitus without complications: Secondary | ICD-10-CM | POA: Diagnosis not present

## 2023-10-17 DIAGNOSIS — Z299 Encounter for prophylactic measures, unspecified: Secondary | ICD-10-CM | POA: Diagnosis not present

## 2023-10-17 DIAGNOSIS — Z6841 Body Mass Index (BMI) 40.0 and over, adult: Secondary | ICD-10-CM | POA: Diagnosis not present

## 2023-11-18 DIAGNOSIS — J069 Acute upper respiratory infection, unspecified: Secondary | ICD-10-CM | POA: Diagnosis not present

## 2023-11-18 DIAGNOSIS — R059 Cough, unspecified: Secondary | ICD-10-CM | POA: Diagnosis not present

## 2023-11-20 DIAGNOSIS — Z299 Encounter for prophylactic measures, unspecified: Secondary | ICD-10-CM | POA: Diagnosis not present

## 2023-11-20 DIAGNOSIS — I10 Essential (primary) hypertension: Secondary | ICD-10-CM | POA: Diagnosis not present

## 2023-11-20 DIAGNOSIS — J069 Acute upper respiratory infection, unspecified: Secondary | ICD-10-CM | POA: Diagnosis not present

## 2023-11-20 DIAGNOSIS — R0981 Nasal congestion: Secondary | ICD-10-CM | POA: Diagnosis not present

## 2023-11-26 DIAGNOSIS — Z23 Encounter for immunization: Secondary | ICD-10-CM | POA: Diagnosis not present

## 2023-12-06 ENCOUNTER — Other Ambulatory Visit: Payer: Self-pay | Admitting: Internal Medicine

## 2024-01-21 DIAGNOSIS — D692 Other nonthrombocytopenic purpura: Secondary | ICD-10-CM | POA: Diagnosis not present

## 2024-01-21 DIAGNOSIS — Z85828 Personal history of other malignant neoplasm of skin: Secondary | ICD-10-CM | POA: Diagnosis not present

## 2024-01-21 DIAGNOSIS — L570 Actinic keratosis: Secondary | ICD-10-CM | POA: Diagnosis not present

## 2024-01-21 DIAGNOSIS — L719 Rosacea, unspecified: Secondary | ICD-10-CM | POA: Diagnosis not present
